# Patient Record
Sex: Female | Born: 1935 | Race: Black or African American | Hispanic: No | Marital: Married | Smoking: Never smoker
Health system: Southern US, Community
[De-identification: ages and names within clinical notes are randomized; demographics above are authoritative.]

## PROBLEM LIST (undated history)

## (undated) DIAGNOSIS — J309 Allergic rhinitis, unspecified: Secondary | ICD-10-CM

## (undated) DIAGNOSIS — I10 Essential (primary) hypertension: Secondary | ICD-10-CM

## (undated) DIAGNOSIS — C50919 Malignant neoplasm of unspecified site of unspecified female breast: Secondary | ICD-10-CM

## (undated) DIAGNOSIS — S62109A Fracture of unspecified carpal bone, unspecified wrist, initial encounter for closed fracture: Secondary | ICD-10-CM

## (undated) DIAGNOSIS — F0390 Unspecified dementia without behavioral disturbance: Secondary | ICD-10-CM

## (undated) DIAGNOSIS — E785 Hyperlipidemia, unspecified: Secondary | ICD-10-CM

## (undated) DIAGNOSIS — M810 Age-related osteoporosis without current pathological fracture: Secondary | ICD-10-CM

## (undated) DIAGNOSIS — R252 Cramp and spasm: Secondary | ICD-10-CM

## (undated) DIAGNOSIS — Z853 Personal history of malignant neoplasm of breast: Secondary | ICD-10-CM

## (undated) HISTORY — DX: Cramp and spasm: R25.2

## (undated) HISTORY — DX: Allergic rhinitis, unspecified: J30.9

## (undated) HISTORY — PX: APPENDECTOMY: SHX54

## (undated) HISTORY — DX: Fracture of unspecified carpal bone, unspecified wrist, initial encounter for closed fracture: S62.109A

## (undated) HISTORY — PX: MODIFIED RADICAL MASTECTOMY W/ AXILLARY LYMPH NODE DISSECTION: SHX2042

## (undated) HISTORY — DX: Personal history of malignant neoplasm of breast: Z85.3

## (undated) HISTORY — PX: OOPHORECTOMY: SHX86

## (undated) HISTORY — DX: Hyperlipidemia, unspecified: E78.5

## (undated) HISTORY — DX: Age-related osteoporosis without current pathological fracture: M81.0

## (undated) HISTORY — DX: Malignant neoplasm of unspecified site of unspecified female breast: C50.919

## (undated) HISTORY — DX: Unspecified dementia, unspecified severity, without behavioral disturbance, psychotic disturbance, mood disturbance, and anxiety: F03.90

## (undated) HISTORY — DX: Essential (primary) hypertension: I10

## (undated) HISTORY — PX: ABDOMINAL HYSTERECTOMY: SHX81

---

## 1998-01-10 ENCOUNTER — Ambulatory Visit (HOSPITAL_COMMUNITY): Admission: RE | Admit: 1998-01-10 | Discharge: 1998-01-10 | Payer: Self-pay | Admitting: Internal Medicine

## 1998-01-10 ENCOUNTER — Encounter: Payer: Self-pay | Admitting: Internal Medicine

## 1999-02-05 ENCOUNTER — Ambulatory Visit (HOSPITAL_COMMUNITY): Admission: RE | Admit: 1999-02-05 | Discharge: 1999-02-05 | Payer: Self-pay | Admitting: Internal Medicine

## 1999-02-05 ENCOUNTER — Encounter: Payer: Self-pay | Admitting: Internal Medicine

## 2000-04-20 ENCOUNTER — Ambulatory Visit (HOSPITAL_COMMUNITY): Admission: RE | Admit: 2000-04-20 | Discharge: 2000-04-20 | Payer: Self-pay | Admitting: Internal Medicine

## 2000-04-20 ENCOUNTER — Encounter: Payer: Self-pay | Admitting: Internal Medicine

## 2002-04-03 ENCOUNTER — Encounter: Payer: Self-pay | Admitting: Internal Medicine

## 2002-04-03 ENCOUNTER — Ambulatory Visit (HOSPITAL_COMMUNITY): Admission: RE | Admit: 2002-04-03 | Discharge: 2002-04-03 | Payer: Self-pay | Admitting: Internal Medicine

## 2004-05-29 ENCOUNTER — Ambulatory Visit: Payer: Self-pay | Admitting: Internal Medicine

## 2004-07-01 ENCOUNTER — Ambulatory Visit: Payer: Self-pay | Admitting: Internal Medicine

## 2005-06-18 ENCOUNTER — Ambulatory Visit: Payer: Self-pay | Admitting: Internal Medicine

## 2005-06-19 ENCOUNTER — Ambulatory Visit: Payer: Self-pay | Admitting: Internal Medicine

## 2006-06-23 ENCOUNTER — Ambulatory Visit: Payer: Self-pay | Admitting: Internal Medicine

## 2006-06-23 LAB — CONVERTED CEMR LAB
ALT: 26 units/L (ref 0–40)
AST: 35 units/L (ref 0–37)
BUN: 13 mg/dL (ref 6–23)
CO2: 30 meq/L (ref 19–32)
Calcium: 9.3 mg/dL (ref 8.4–10.5)
Chloride: 103 meq/L (ref 96–112)
Cholesterol: 234 mg/dL (ref 0–200)
Creatinine, Ser: 1 mg/dL (ref 0.4–1.2)
Direct LDL: 148.8 mg/dL
GFR calc Af Amer: 69 mL/min
GFR calc non Af Amer: 57 mL/min
Glucose, Bld: 90 mg/dL (ref 70–99)
HDL: 59.2 mg/dL (ref >39.0)
Potassium: 3.7 meq/L (ref 3.5–5.1)
Sodium: 141 meq/L (ref 135–145)
TSH: 5.35 microintl units/mL (ref 0.35–5.50)
Total CHOL/HDL Ratio: 4
Triglycerides: 85 mg/dL (ref 0–149)
VLDL: 17 mg/dL (ref 0–40)

## 2007-03-23 ENCOUNTER — Encounter: Payer: Self-pay | Admitting: *Deleted

## 2007-03-23 DIAGNOSIS — Z9089 Acquired absence of other organs: Secondary | ICD-10-CM | POA: Insufficient documentation

## 2007-03-23 DIAGNOSIS — I10 Essential (primary) hypertension: Secondary | ICD-10-CM | POA: Insufficient documentation

## 2007-03-23 DIAGNOSIS — E785 Hyperlipidemia, unspecified: Secondary | ICD-10-CM | POA: Insufficient documentation

## 2007-03-23 DIAGNOSIS — R252 Cramp and spasm: Secondary | ICD-10-CM | POA: Insufficient documentation

## 2007-03-23 DIAGNOSIS — J309 Allergic rhinitis, unspecified: Secondary | ICD-10-CM | POA: Insufficient documentation

## 2007-03-23 DIAGNOSIS — S62109A Fracture of unspecified carpal bone, unspecified wrist, initial encounter for closed fracture: Secondary | ICD-10-CM | POA: Insufficient documentation

## 2007-03-23 DIAGNOSIS — M810 Age-related osteoporosis without current pathological fracture: Secondary | ICD-10-CM | POA: Insufficient documentation

## 2007-05-05 ENCOUNTER — Ambulatory Visit (HOSPITAL_COMMUNITY): Admission: RE | Admit: 2007-05-05 | Discharge: 2007-05-05 | Payer: Self-pay | Admitting: Internal Medicine

## 2007-06-08 LAB — HM MAMMOGRAPHY: HM Mammogram: NORMAL

## 2007-07-14 ENCOUNTER — Ambulatory Visit: Payer: Self-pay | Admitting: Internal Medicine

## 2007-07-14 LAB — CONVERTED CEMR LAB
ALT: 29 units/L (ref 0–35)
AST: 36 units/L (ref 0–37)
Albumin: 4.2 g/dL (ref 3.5–5.2)
Alkaline Phosphatase: 74 units/L (ref 39–117)
BUN: 13 mg/dL (ref 6–23)
Bilirubin, Direct: 0.1 mg/dL (ref 0.0–0.3)
CO2: 29 meq/L (ref 19–32)
Calcium: 9.1 mg/dL (ref 8.4–10.5)
Chloride: 98 meq/L (ref 96–112)
Cholesterol: 239 mg/dL (ref 0–200)
Creatinine, Ser: 0.9 mg/dL (ref 0.4–1.2)
Direct LDL: 148.9 mg/dL
GFR calc Af Amer: 77 mL/min
GFR calc non Af Amer: 64 mL/min
Glucose, Bld: 98 mg/dL (ref 70–99)
HDL: 63.5 mg/dL (ref >39.0)
Potassium: 3.7 meq/L (ref 3.5–5.1)
Sodium: 139 meq/L (ref 135–145)
TSH: 4.38 microintl units/mL (ref 0.35–5.50)
Total Bilirubin: 0.9 mg/dL (ref 0.3–1.2)
Total CHOL/HDL Ratio: 3.8
Total Protein: 7.4 g/dL (ref 6.0–8.3)
Triglycerides: 51 mg/dL (ref 0–149)
VLDL: 10 mg/dL (ref 0–40)

## 2007-07-15 ENCOUNTER — Encounter: Payer: Self-pay | Admitting: Internal Medicine

## 2008-10-09 ENCOUNTER — Ambulatory Visit: Payer: Self-pay | Admitting: Internal Medicine

## 2008-10-09 DIAGNOSIS — Z853 Personal history of malignant neoplasm of breast: Secondary | ICD-10-CM | POA: Insufficient documentation

## 2008-10-09 LAB — CONVERTED CEMR LAB
BUN: 13 mg/dL (ref 6–23)
Basophils Absolute: 0 10*3/uL (ref 0.0–0.1)
Basophils Relative: 0.2 % (ref 0.0–3.0)
CO2: 33 meq/L — ABNORMAL HIGH (ref 19–32)
Calcium: 9.1 mg/dL (ref 8.4–10.5)
Chloride: 105 meq/L (ref 96–112)
Creatinine, Ser: 1 mg/dL (ref 0.4–1.2)
Eosinophils Absolute: 0.1 10*3/uL (ref 0.0–0.7)
Eosinophils Relative: 1.5 % (ref 0.0–5.0)
GFR calc non Af Amer: 68.17 mL/min (ref >60)
Glucose, Bld: 95 mg/dL (ref 70–99)
HCT: 41 % (ref 36.0–46.0)
Hemoglobin: 14 g/dL (ref 12.0–15.0)
Lymphocytes Relative: 34.9 % (ref 12.0–46.0)
Lymphs Abs: 2.1 10*3/uL (ref 0.7–4.0)
MCHC: 34.1 g/dL (ref 30.0–36.0)
MCV: 95 fL (ref 78.0–100.0)
Magnesium: 2.6 mg/dL — ABNORMAL HIGH (ref 1.5–2.5)
Monocytes Absolute: 0.5 10*3/uL (ref 0.1–1.0)
Monocytes Relative: 7.9 % (ref 3.0–12.0)
Neutro Abs: 3.2 10*3/uL (ref 1.4–7.7)
Neutrophils Relative %: 55.5 % (ref 43.0–77.0)
Platelets: 233 10*3/uL (ref 150.0–400.0)
Potassium: 3.6 meq/L (ref 3.5–5.1)
RBC: 4.31 M/uL (ref 3.87–5.11)
RDW: 12.3 % (ref 11.5–14.6)
Sodium: 142 meq/L (ref 135–145)
WBC: 5.9 10*3/uL (ref 4.5–10.5)

## 2009-10-09 ENCOUNTER — Encounter: Payer: Self-pay | Admitting: Internal Medicine

## 2009-10-10 ENCOUNTER — Ambulatory Visit: Payer: Self-pay | Admitting: Internal Medicine

## 2009-10-10 LAB — CONVERTED CEMR LAB
ALT: 24 units/L (ref 0–35)
AST: 28 units/L (ref 0–37)
Albumin: 4.2 g/dL (ref 3.5–5.2)
Alkaline Phosphatase: 68 units/L (ref 39–117)
BUN: 13 mg/dL (ref 6–23)
Basophils Absolute: 0 10*3/uL (ref 0.0–0.1)
Basophils Relative: 0 % (ref 0.0–3.0)
Bilirubin, Direct: 0.2 mg/dL (ref 0.0–0.3)
CO2: 28 meq/L (ref 19–32)
Calcium: 9.3 mg/dL (ref 8.4–10.5)
Chloride: 103 meq/L (ref 96–112)
Cholesterol: 256 mg/dL — ABNORMAL HIGH (ref 0–200)
Creatinine, Ser: 0.9 mg/dL (ref 0.4–1.2)
Direct LDL: 176.2 mg/dL
Eosinophils Absolute: 0.1 10*3/uL (ref 0.0–0.7)
Eosinophils Relative: 1.9 % (ref 0.0–5.0)
GFR calc non Af Amer: 78.82 mL/min (ref >60)
Glucose, Bld: 84 mg/dL (ref 70–99)
HCT: 40.6 % (ref 36.0–46.0)
HDL: 60.5 mg/dL (ref >39.00)
Hemoglobin: 13.8 g/dL (ref 12.0–15.0)
Lymphocytes Relative: 28.2 % (ref 12.0–46.0)
Lymphs Abs: 1.8 10*3/uL (ref 0.7–4.0)
MCHC: 33.9 g/dL (ref 30.0–36.0)
MCV: 96.4 fL (ref 78.0–100.0)
Monocytes Absolute: 0.4 10*3/uL (ref 0.1–1.0)
Monocytes Relative: 6.8 % (ref 3.0–12.0)
Neutro Abs: 4 10*3/uL (ref 1.4–7.7)
Neutrophils Relative %: 63.1 % (ref 43.0–77.0)
Platelets: 253 10*3/uL (ref 150.0–400.0)
Potassium: 4.4 meq/L (ref 3.5–5.1)
RBC: 4.21 M/uL (ref 3.87–5.11)
RDW: 13.4 % (ref 11.5–14.6)
Sodium: 140 meq/L (ref 135–145)
Total Bilirubin: 0.9 mg/dL (ref 0.3–1.2)
Total CHOL/HDL Ratio: 4
Total Protein: 7.2 g/dL (ref 6.0–8.3)
Triglycerides: 124 mg/dL (ref 0.0–149.0)
VLDL: 24.8 mg/dL (ref 0.0–40.0)
WBC: 6.3 10*3/uL (ref 4.5–10.5)

## 2009-10-13 ENCOUNTER — Telehealth: Payer: Self-pay | Admitting: Internal Medicine

## 2009-12-23 ENCOUNTER — Telehealth: Payer: Self-pay | Admitting: Internal Medicine

## 2010-01-10 ENCOUNTER — Telehealth: Payer: Self-pay | Admitting: Internal Medicine

## 2010-01-13 ENCOUNTER — Ambulatory Visit: Payer: Self-pay | Admitting: Internal Medicine

## 2010-01-13 DIAGNOSIS — F0390 Unspecified dementia without behavioral disturbance: Secondary | ICD-10-CM | POA: Insufficient documentation

## 2010-01-13 DIAGNOSIS — F22 Delusional disorders: Secondary | ICD-10-CM

## 2010-03-27 NOTE — Progress Notes (Signed)
Summary: OV NEXT WEEK  Phone Note Call from Patient   Summary of Call: Pt's granddaughter called. She is concerned about pt's behavior. Pt has decreased her walking & driving - Pt feels this is due to fear. Pt had her oil changed and they accidently broke her tail light. Pt felt that there was a conspiracy - the car place was going to put a GPS in her taillight and follow her home and break in the house. She also thought that life alert, onstar and Dr Debby Bud all were working together to know when she took her meds. She is very detailed w/her stories and believes them. Family is worried by this new anxiety & paranoia. This is not a sudden onset, family has noticed over several weeks.   Pt has apt next week.  Initial call taken by: Lamar Sprinkles, CMA,  January 10, 2010 2:36 PM

## 2010-03-27 NOTE — Progress Notes (Signed)
----   Converted from flag ---- ---- 10/11/2009 9:20 AM, Verdell Face wrote: Dr Debby Bud,   I called pt to set up appt and she does not want to sched at this time she will call in the future to set it up.  Cheryl  ---- 10/11/2009 3:50 AM, Jacques Navy MD wrote: 'Morning!!  Please: 1. schedule for DXA 2. needs returns office visit 3 weeks after her DXA scan  Thanks ------------------------------

## 2010-03-27 NOTE — Assessment & Plan Note (Signed)
Summary: follow up per md-lb   Vital Signs:  Patient profile:   77 year old female Weight:      128 pounds BMI:     25.09 Temp:     98.4 degrees F oral Pulse rate:   96 / minute BP sitting:   170 / 82  (left arm) Cuff size:   regular  Vitals Entered By: Lamar Sprinkles, CMA (January 13, 2010 10:41 AM) CC: Problems w/Diovan - has been off med x 14 days.    Primary Care Troy Hartzog:  [facility]  CC:  Problems w/Diovan - has been off med x 14 days. Marland Kitchen  History of Present Illness: Patient presents with her daughter and granddaughter who called with concerns about her behavior. She has multiple well contructed paranoid delusions: contaimnated city water, electronic interference with her medications, etc. Her family reports that she is having more difficulty processing information and is highly influenced by what she perceives. She has become slightly more withdrawn, stopped some of her activities and is drivng less - due to her delusions about her car electronics.  Her daughter feels she is better over the past 12 days having stopped diovan/hct.  Allergies: 1)  ! Sulfa  Past History:  Past Medical History: Last updated: 2008/11/08 OSTEOPOROSIS (ICD-733.00) ALLERGIC RHINITIS (ICD-477.9) LEG CRAMPS, NOCTURNAL (ICD-729.82) Hx of FRACTURE, WRIST, LEFT (ICD-814.00) ADENOCARCINOMA, BREAST, HX OF (ICD-V10.3) HYPERLIPIDEMIA, MILD (ICD-272.4) HYPERTENSION (ICD-401.9)     Past Surgical History: Last updated: 11-08-08 APPENDECTOMY, HX OF (ICD-V45.79) MASTECTOMY, RADICAL, WITH AXILLARY LYMPH NODES, HX OF LEFT (ICD-V45.71) Hysterectomy Oophorectomy  Family History: Last updated: Nov 08, 2008 father - deceased : CVA mother-deceased: respiratory failure, progressive Alzheimer's Neg- DM, breast Cancer, colon cancer  Social History: Last updated: 10/10/2009 college graduate Native of [place] married '50- widowed 10/09 2 daughters; 5 grandchildren; several great grandchildren Lives  alone and remains independent.  She does have [employer] in her vehicle; full life-alert system in the home. She has bought her burial plot.   Review of Systems  The patient denies anorexia, fever, weight loss, decreased hearing, chest pain, dyspnea on exertion, headaches, abdominal pain, muscle weakness, and unusual weight change.    Physical Exam  General:  Well-developed,well-nourished,in no acute distress; alert,appropriate and cooperative throughout examination Head:  Normocephalic and atraumatic without obvious abnormalities. No apparent alopecia or balding. Eyes:  No corneal or conjunctival inflammation noted. EOMI. Perrla. Funduscopic exam benign, without hemorrhages, exudates or papilledema. Vision grossly normal. Lungs:  Normal respiratory effort, chest expands symmetrically. Lungs are clear to auscultation, no crackles or wheezes. Heart:  normal rate and regular rhythm.   Skin:  turgor normal and color normal.   Psych:  oriented x 3. Pressured speech, rapid flow of ideas, paranoid delusional constructs.   Impression & Recommendations:  Problem # 1:  HYPERTENSION (ICD-401.9) Poor control today - off meds for 12 days.  Plan - resume diovan plain at previous dose.   Her updated medication list for this problem includes:    Diovan 80 Mg Tabs (Valsartan) .Marland Kitchen... 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  Plan - willneed neuro-imaging           will need to consider low dose anti-psychotic medication - patient currently refusing psych eval.            f/u visit soon.   Complete Medication List: 1)  Diovan  80 Mg Tabs (Valsartan) .Marland Kitchen.. 1 once daily 2)  Advil 200 Mg Tabs (Ibuprofen) .... As directed as needed Prescriptions: DIOVAN 80 MG TABS (VALSARTAN) 1 once daily  #90 x 1   Entered by:   Lamar Sprinkles, CMA   Authorized by:    Jacques Navy MD   Signed by:   Lamar Sprinkles, CMA on 01/13/2010   Method used:   Electronically to        [facility]  [phone]* (retail)       [address]       [place], [place]  [address]       Ph: [phone] or [phone]       Fax: [phone]   RxID:   [id number]    Orders Added: 1)  Est. Patient Level IV [[address]644]

## 2010-03-27 NOTE — Progress Notes (Signed)
Summary: MED REACTION?  Phone Note Call from Patient Call back at [facility] Phone [phone]   Caller: Patient Summary of Call: Pt thinks she is having a problem w/her Diovan HCT 80-12.5 tab , blurry eyes and wants to be put back on prev medication -- Diovan 80 mg tab. Pt requests a call from a nurse asap. Initial call taken by: Verdell Face,  December 23, 2009 10:09 AM  Follow-up for Phone Call        Spoke w/pt, she c/o left sided h/a, blurred vision and right hand swelling since starting Diovan HCT. She wants to resume DIOVAN 80mg .   Pt thinks that the HCTZ has sulfa in it and she is allergic. She also wanted to tell MD that she takes long walks every day to get the medication out of her system but continues to take the med daily.   She started the new med w/HCTZ in August and felt that she needed time to get adjusted to the medication but now wants to resume Diovan 80mg . Please advise.  Follow-up by: Lamar Sprinkles, CMA,  December 23, 2009 12:15 PM  Additional Follow-up for Phone Call Additional follow up Details #1::        OK to resume diovan 80, #30, refill prn.. will need BP check in 3 weeks.  Additional Follow-up by: Jacques Navy MD,  December 23, 2009 1:09 PM    Additional Follow-up for Phone Call Additional follow up Details #2::    Pt informed, FYI - pt wanted Dr Debby Bud to know she did the women's only walk in 47 minutes.  Follow-up by: Lamar Sprinkles, CMA,  December 23, 2009 1:22 PM  New/Updated Medications: DIOVAN 80 MG TABS (VALSARTAN) 1 once daily Prescriptions: DIOVAN 80 MG TABS (VALSARTAN) 1 once daily  #90 x 1   Entered by:   Lamar Sprinkles, CMA   Authorized by:   Jacques Navy MD   Signed by:   Lamar Sprinkles, CMA on 12/23/2009   Method used:   Electronically to        [facility]  [phone]* (retail)       [address]       [place], [place]  [address]       Ph: [phone] or [phone]       Fax: [phone]   RxID:    [id number]

## 2010-03-27 NOTE — Progress Notes (Signed)
Summary: Life Alert ID Card  Life Alert ID Card   Imported By: Lester [place] 10/14/2009 08:10:02  _____________________________________________________________________  External Attachment:    Type:   Image     Comment:   External Document

## 2010-03-27 NOTE — Assessment & Plan Note (Signed)
Summary: YEARLY FU/ MEDICARE/LABS SAME DAY/NWS  #   Vital Signs:  Patient profile:   77 year old female Height:      60 inches Weight:      131 pounds BMI:     25.68 O2 Sat:      97 % on Room air Temp:     98.4 degrees F oral Pulse rate:   71 / minute BP sitting:   210 / 98  (left arm) Cuff size:   regular  Vitals Entered By: Bill Salinas CMA (October 10, 2009 10:09 AM)  O2 Flow:  Room air CC: pt here for yearly physical/ ab  Does patient need assistance? Ambulation Normal Comments Pt now had Life Alert.   Vision Screening:      Vision Comments: Pt had eye exam April 2011 Normal Exam at [facility]   Primary Care Provider:  Willma Obando  CC:  pt here for yearly physical/ ab.  History of Present Illness: Patient presents for follow-up. She is anxious in the office. She has a real concern about dementia and is very worried about medicine as a cause of mental status changes.   Her BP is very high today. She reports that her BP at home runs 140+ on a regular basis.  she remains active and is playing tennis and walks. She is very health conscious. She is living alone - husband passed away 07-11-2022- and remains very independent in all her ADLs.   She has no specific medical complaints.  She does not continue with mammograms; she has aged out of colonoscopy. She has declined all immunizations. Gave a full explanation of the rationale for pneumonia vaccine and shingles vaccine. After being informed she still declines immunizations.   She feels fine. She has a great appetite and eats a very healthy diet.   Preventive Screening-Counseling & Management  Alcohol-Tobacco     Alcohol drinks/day: 0     Smoking Status: never  Caffeine-Diet-Exercise     Caffeine use/day: 2 cups a day     Does Patient Exercise: yes     Type of exercise: Walking     Exercise (avg: min/session): 30-60     Times/week: 7  Hep-HIV-STD-Contraception     Dental Visit-last 6 months yes     Sun  Exposure-Excessive: no  Safety-Violence-Falls     Seat Belt Use: yes     Firearms in the Home: no firearms in the home     Smoke Detectors: yes     Violence in the Home: no risk noted     Sexual Abuse: no     Fall Risk: low risk      Drug Use:  never.    Current Medications (verified): 1)  Diovan 80 Mg  Tabs (Valsartan) .... Take One Tablet Once Daily 2)  Advil 200 Mg  Tabs (Ibuprofen) .... As Directed As Needed  Allergies (verified): 1)  ! Sulfa  Past History:  Past Medical History: Last updated: 10/18/2008 OSTEOPOROSIS (ICD-733.00) ALLERGIC RHINITIS (ICD-477.9) LEG CRAMPS, NOCTURNAL (ICD-729.82) Hx of FRACTURE, WRIST, LEFT (ICD-814.00) ADENOCARCINOMA, BREAST, HX OF (ICD-V10.3) HYPERLIPIDEMIA, MILD (ICD-272.4) HYPERTENSION (ICD-401.9)     Past Surgical History: Last updated: 10-18-2008 APPENDECTOMY, HX OF (ICD-V45.79) MASTECTOMY, RADICAL, WITH AXILLARY LYMPH NODES, HX OF LEFT (ICD-V45.71) Hysterectomy Oophorectomy  Family History: Last updated: 10/18/2008 father - deceased : CVA mother-deceased: respiratory failure, progressive Alzheimer's Neg- DM, breast Cancer, colon cancer  Social History: Last updated: 10/10/2009 college graduate Native of [place] married '50- widowed 10/09 2  daughters; 5 grandchildren; several great grandchildren Lives alone and remains independent.  She does have [employer] in her vehicle; full life-alert system in the home. She has bought her burial plot.   Social History: college graduate Native of [place] married '50- widowed 10/09 2 daughters; 5 grandchildren; several great grandchildren Lives alone and remains independent.  She does have [employer]-Star in her vehicle; full life-alert system in the home. She has bought her burial plot. Caffeine use/day:  2 cups a day Dental Care w/in 6 mos.:  yes Sun Exposure-Excessive:  no Seat Belt Use:  yes Fall Risk:  low risk Drug Use:  never  Review of Systems  The patient denies  anorexia, fever, weight loss, weight gain, vision loss, decreased hearing, hoarseness, chest pain, syncope, dyspnea on exertion, peripheral edema, headaches, hemoptysis, abdominal pain, hematochezia, incontinence, muscle weakness, transient blindness, difficulty walking, depression, abnormal bleeding, and angioedema.    Physical Exam  General:  WNWD AA female who looks younger than her stated age Head:  Normocephalic and atraumatic without obvious abnormalities. No apparent alopecia or balding. Eyes:  No corneal or conjunctival inflammation noted. EOMI. Perrla. Funduscopic exam benign, without hemorrhages, exudates or papilledema. Vision grossly normal. Ears:  R ear normal and L ear normal.   Nose:  no external deformity and no external erythema.   Mouth:  Oral mucosa and oropharynx without lesions or exudates.  Teeth in good repair. Neck:  supple, full ROM, no thyromegaly, no carotid bruits, and no cervical lymphadenopathy.   Chest Wall:  No deformities, masses, or tenderness noted. Breasts:  s/p left radical mastectomy. Right breast - no nipple discharge, no thickening., no nodules or abnormality. Lungs:  normal respiratory effort, no intercostal retractions, no accessory muscle use, and normal breath sounds.   Heart:  normal rate, regular rhythm, no murmur, no gallop, no JVD, and no HJR.   Abdomen:  soft, non-tender, normal bowel sounds, no masses, no guarding, no abdominal hernia, and no inguinal hernia.   Msk:  normal ROM, no joint tenderness, no joint swelling, no joint warmth, and no joint deformities.   Pulses:  2+ radial and DP pulses Extremities:  trace left pedal edema.   Neurologic:  alert & oriented X3, cranial nerves II-XII intact, strength normal in all extremities, sensation intact to light touch, gait normal, and DTRs symmetrical and normal.   Skin:  turgor normal, color normal, no rashes, no suspicious lesions, no petechiae, and no ulcerations.   Cervical Nodes:  no anterior  cervical adenopathy and no posterior cervical adenopathy.   Axillary Nodes:  no R axillary adenopathy and no L axillary adenopathy.   Psych:  Oriented X3, memory intact for recent and remote, normally interactive, good eye contact, and not anxious appearing.     Impression & Recommendations:  Problem # 1:  OSTEOPOROSIS (ICD-733.00) No DXA scan in EMR. No bone enhancing medications listed. She does follow a healthy diet but no calcium or vitamin D supplement is listed.  Plan - will schedule DXA scan with recommendations to follow.  Problem # 2:  HYPERLIPIDEMIA, MILD (ICD-272.4) Due for routine lab follow-up with recommendations to follow.  Orders: TLB-Lipid Panel (80061-LIPID) TLB-Hepatic/Liver Function Pnl (80076-HEPATIC)  Addendum - very high LDL along with a high HDL. Per NCEP guidelines she is a candidate for medical intervention.  Plan - will have her return to discuss medical therapy for elevated cholesterol levels  Problem # 3:  HYPERTENSION (ICD-401.9)  Her updated medication list for this problem includes:  Diovan Hct 80-12.5 Mg Tabs (Valsartan-hydrochlorothiazide) .Marland Kitchen... 1 by mouth once daily  Orders: TLB-BMP (Basic Metabolic Panel-BMET) (80048-METABOL)  BP today: 210/98  Poor control at today's visit and last visit. She claims better control when BP checked outside the office with SBP 140-150's. She is asymptomatic.  Plan - change medication to Diovan/HCT           follow-up BP check  Problem # 4:  Preventive Health Care (ICD-V70.0)  Patient reports no change in medical condition and has no complaints. Her exam is normal except for elevated BP. Lab results are normal except for cholesterol panel. She has consistently declined colonoscopy and last mammogrm was '09 and she declines further studies. She has declined all immunizations.  Ms. Bolanos does live alone and is independent in all ADLs. She shows signs of anxiety but appears rational and free of depression. She  is very active with no increased risk of falls or injury.   In summary - a youthful patient who has uncontrolled hypertension and lipid elevation and who declinces preventive recommendations. She will be asked to return for a DXA scan and a return office visit to follow.   Orders: [facility] -Subsequent Annual Wellness Visit [phone])  Complete Medication List: 1)  Diovan Hct 80-12.5 Mg Tabs (Valsartan-hydrochlorothiazide) .Marland Kitchen.. 1 by mouth once daily 2)  Advil 200 Mg Tabs (Ibuprofen) .... As directed as needed  Other Orders: TLB-CBC Platelet - w/Differential (85025-CBCD)  ent: Miette Mccoin Note: All result statuses are Final unless otherwise noted.  Tests: (1) Lipid Panel (LIPID)   Cholesterol          [H]  256 mg/dL                   9-811     ATP III Classification            Desirable:  < 200 mg/dL                    Borderline High:  200 - 239 mg/dL               High:  > = 240 mg/dL   Triglycerides             124.0 mg/dL                 9.1-478.2     Normal:  <150 mg/dL     Borderline High:  956 - 199 mg/dL   HDL                       21.30 mg/dL                 >86.57   VLDL Cholesterol          24.8 mg/dL                  8.4-69.6  CHO/HDL Ratio:  CHD Risk                             4                    Men          Women     1/2 Average Risk     3.4          3.3     Average Risk  5.0          4.4     2X Average Risk          9.6          7.1     3X Average Risk          15.0          11.0                           Tests: (2) Hepatic/Liver Function Panel (HEPATIC)   Total Bilirubin           0.9 mg/dL                   0.3-4.7   Direct Bilirubin          0.2 mg/dL                   4.2-5.9   Alkaline Phosphatase      68 U/L                      39-117   AST                       28 U/L                      0-37   ALT                       24 U/L                      0-35   Total Protein             7.2 g/dL                    5.6-3.8   Albumin                   4.2  g/dL                    7.5-6.4  Tests: (3) BMP (METABOL)   Sodium                    140 mEq/L                   135-145   Potassium                 4.4 mEq/L                   3.5-5.1   Chloride                  103 mEq/L                   96-112   Carbon Dioxide            28 mEq/L                    19-32   Glucose                   84 mg/dL                    33-29   BUN  13 mg/dL                    4-40   Creatinine                0.9 mg/dL                   1.0-2.7   Calcium                   9.3 mg/dL                   2.5-36.6   GFR                       78.82 mL/min                >60  Tests: (4) CBC Platelet w/Diff (CBCD)   White Cell Count          6.3 K/uL                    4.5-10.5   Red Cell Count            4.21 Mil/uL                 3.87-5.11   Hemoglobin                13.8 g/dL                   44.0-34.7   Hematocrit                40.6 %                      36.0-46.0   MCV                       96.4 fl                     78.0-100.0   MCHC                      33.9 g/dL                   42.5-95.6   RDW                       13.4 %                      11.5-14.6   Platelet Count            253.0 K/uL                  150.0-400.0   Neutrophil %              63.1 %                      43.0-77.0   Lymphocyte %              28.2 %                      12.0-46.0   Monocyte %                6.8 %  3.0-12.0   Eosinophils%              1.9 %                       0.0-5.0   Basophils %               0.0 %                       0.0-3.0   Neutrophill Absolute      4.0 K/uL                    1.4-7.7   Lymphocyte Absolute       1.8 K/uL                    0.7-4.0   Monocyte Absolute         0.4 K/uL                    0.1-1.0  Eosinophils, Absolute                             0.1 K/uL                    0.0-0.7   Basophils Absolute        0.0 K/uL                    0.0-0.1  Tests: (5) Cholesterol LDL - Direct (DIRLDL)   Cholesterol LDL - Direct                             176.2 mg/dL     Optimal:  <161 mg/dL     Near or Above Optimal:  100-129 mg/dL     Borderline High:  096-045 mg/dL     High:  409-811 mg/dL     Very High:  >914 mg/dLPrescriptions: DIOVAN HCT 80-12.5 MG TABS (VALSARTAN-HYDROCHLOROTHIAZIDE) 1 by mouth once daily  #30 x 12   Entered and Authorized by:   Jacques Navy MD   Signed by:   Lamar Sprinkles, CMA on 10/10/2009   Method used:   Electronically to        [facility]  [phone]* (retail)       [address]       [place], [place]  [address]       Ph: [phone] or [phone]       Fax: [phone]   RxID:   [id number]   Preventive Care Screening  Last Pneumovax:    Date:  10/10/2009    Results:  Dclined  Last Flu Shot:    Date:  10/10/2009    Results:  Declined  Last Tetanus Booster:    Date:  10/10/2009    Results:  Declined  Colonoscopy:    Date:  10/10/2009    Results:  Declined

## 2010-05-16 ENCOUNTER — Telehealth: Payer: Self-pay | Admitting: *Deleted

## 2010-05-16 NOTE — Telephone Encounter (Signed)
OK. Please have patient called to come in for routine BP check/follow-up. It is important that a family member comes to the visit with her.  Thanks

## 2010-05-16 NOTE — Telephone Encounter (Signed)
Pt's daughter left vm, she feels that pt's confusion and paranoia have increased. She feels that pt needs re-eval from MD but does not think that pt will be willing to come in if she suggests and would like our office to call pt to schedule for another reason... ? BP check. What do you think?

## 2010-05-19 NOTE — Telephone Encounter (Signed)
Spoke w/daughter, we will attempt to schedule pt for f/u apt.

## 2010-06-05 ENCOUNTER — Encounter: Payer: Self-pay | Admitting: Internal Medicine

## 2010-06-05 ENCOUNTER — Ambulatory Visit (INDEPENDENT_AMBULATORY_CARE_PROVIDER_SITE_OTHER): Payer: Medicare Other | Admitting: Internal Medicine

## 2010-06-05 DIAGNOSIS — I10 Essential (primary) hypertension: Secondary | ICD-10-CM

## 2010-06-05 DIAGNOSIS — F0392 Unspecified dementia, unspecified severity, with psychotic disturbance: Secondary | ICD-10-CM

## 2010-06-05 DIAGNOSIS — F0390 Unspecified dementia without behavioral disturbance: Secondary | ICD-10-CM

## 2010-06-05 DIAGNOSIS — F22 Delusional disorders: Secondary | ICD-10-CM
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
## 2010-06-16 ENCOUNTER — Telehealth: Payer: Self-pay | Admitting: *Deleted

## 2010-06-16 DIAGNOSIS — R4182 Altered mental status, unspecified: Secondary | ICD-10-CM

## 2010-06-16 NOTE — Telephone Encounter (Signed)
Daughter is req "brain scan" prior to psych work up. She has MD friend who suggested they inquire about this first due to pt's delusions to r/o stroke or other causes.

## 2010-06-17 NOTE — Telephone Encounter (Signed)
Daughter aware, will need to call pt and explain that MD is ordering MRI (PCC's already called pt and she refused apt)

## 2010-06-17 NOTE — Telephone Encounter (Signed)
I think it is a very long shot....Marland KitchenMarland KitchenOK for MRI brain without contrast to rule out tumor or stroke as cause of mental status changes.

## 2010-06-23 ENCOUNTER — Other Ambulatory Visit (HOSPITAL_COMMUNITY): Payer: Medicare Other

## 2010-06-26 NOTE — Telephone Encounter (Signed)
Hold note - Maralyn Sago to call and discuss more with pt next week.

## 2010-07-11 NOTE — Assessment & Plan Note (Signed)
[facility]                           PRIMARY CARE OFFICE NOTE   NAME:Vonbehren, JERMYA DOWDING                        MRN:          [id number]  DATE:06/23/2006                            DOB:          August 19, 1924    Mrs. Vinton is a delightful 77 year old African American women who  presents for follow up evaluation and exam.  She was last seen June 18, 2005.  Please see that dictation for a complete past medical history,  family history and social history.   INTERVAL SOCIAL HISTORY:  The patient has been hospitalized with a new  stroke and has now returned home.  This stroke has affected her behavior  significantly and she requires much more attention.  She has a tendency  to wonder, she has a tendency to disrobe and she has even a worse  memory.  The patient is determined in management at home and does have  the help of her children.  She herself is walking daily and looking  forward to resuming tennis.   CURRENT MEDICATIONS:  1. Diovan 80 mg daily.  2. Allegra 180 mg p.r.n.   REVIEW OF SYSTEMS:  Negative for any constitutional, cardiovascular,  respiratory, GI, GU problems.   EXAMINATION:  Temperature was 99.1, blood pressure 181/93, pulse 85,  weight 126.  GENERAL APPEARANCE:  This is a well-nourished, well-developed, well-  groomed women looking younger than her stated chronologic age in no  acute distress.  HEENT:  Normocephalic, atraumatic, EAC's and TM's were normal,  oropharynx with native dentition in good repair, no buccal or pallet  lesions were noted, posterior pharynx was clear, conjunctivae and  sclerae was clear, PERRLA, EM OI, funduscopic exam was unremarkable.  NECK:  Supple without thyromegaly, no cervical adenopathy was noted in  the cervical supraclavicular regions.  CHEST:  No CVA tenderness.  LUNGS:  Clear to auscultation and percussion.  BREAST:  The patient is status post radical mastectomy on the left.  She  has a well healed  surgical scar with no abnormality.  Right breast is  normal with normal skin nipples without discharge, no fixed mass lesion  or abnormality was noted, there is no axillary adenopathy.  CARDIOVASCULAR:  With 2+ radial pulse, no jugular venous distension or  carotid bruits, she has a quiet pericardium with a regular rate and  rhythm without murmurs, rubs or gallops.  ABDOMEN:  Soft, no guarding, no rebound, no organo splenomegaly was  noted.  PELVIC:  Deferred with patient being status post TAH BSO.  RECTAL:  Deferred.  EXTREMITIES:  Without clubbing, cyanosis, edema or deformity.  NEUROLOGIC:  Non focal.   LAB WORK:  Revealed a total cholesterol of 234, triglycerides 85, HDL  was 59.2, LDL cholesterol 148.8, thyroid function with TSH of 5.35,  basic metabolic panel was unremarkable with a serum glucose of 90,  kidney function was normal with a creatinine of 1.0, liver functions  were normal.   ASSESSMENT/PLAN:  1. Hypertension.  The patient reports that she checks her blood      pressure regularly at home and it runs  in the 130's over 80's.      Plan, the patient will continue on her present medication.  She      should continue to check her blood pressure on a regular basis.  2. Hyperlipidemia.  The patient with mildly elevated cholesterol.      Given her robust HDL, her level of activity, her age with no other      cardiac risk factors.  She has a NCEP goal of less than 160.  I      would encourage her to continue her healthy diet and level of      activity.  3. Health maintenance.  The patient's last mammogram was in 2004.  I      offered to schedule the patient's mammogram, but she declined.  She      is reticent to have this done at this time because of the      discomfort.  I have encouraged her to reconsider.   Colorectal cancer screening, I discussed this with the patient including  risks and benefits.  She is very weary and concerned about possible  complications.  She  feels that at her age of 16 with no problems, no  change in bowel habit she will defer colonoscopy at this time.   In summary, this is a very bright and alert women who seems very healthy  and looks younger than her stated age.  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   I have asked the patient to return to see me on a p.r.n. basis or in one  year.     Rosalyn Gess Norins, MD  Electronically Signed    MEN/MedQ  DD: 06/23/2006  DT: 06/24/2006  Job #: [id number]   cc:   Wandalee Ferdinand

## 2010-12-28 ENCOUNTER — Other Ambulatory Visit: Payer: Self-pay | Admitting: Internal Medicine

## 2010-12-31 ENCOUNTER — Other Ambulatory Visit: Payer: Self-pay | Admitting: *Deleted

## 2010-12-31 MED ORDER — VALSARTAN 80 MG PO TABS: 80.0000 mg | ORAL_TABLET | Freq: Every day | ORAL | Status: DC

## 2011-06-28 DIAGNOSIS — S82409A Unspecified fracture of shaft of unspecified fibula, initial encounter for closed fracture: Secondary | ICD-10-CM | POA: Diagnosis not present

## 2012-01-05 ENCOUNTER — Other Ambulatory Visit: Payer: Self-pay | Admitting: Internal Medicine

## 2012-04-05 ENCOUNTER — Other Ambulatory Visit: Payer: Self-pay | Admitting: Internal Medicine

## 2012-05-08 ENCOUNTER — Other Ambulatory Visit: Payer: Self-pay | Admitting: Internal Medicine

## 2012-05-10 ENCOUNTER — Other Ambulatory Visit: Payer: Self-pay

## 2012-05-10 MED ORDER — VALSARTAN 80 MG PO TABS: ORAL_TABLET | ORAL | Status: DC

## 2012-06-06 ENCOUNTER — Other Ambulatory Visit: Payer: Self-pay | Admitting: Internal Medicine

## 2012-06-09 ENCOUNTER — Encounter: Payer: Medicare Other | Admitting: Internal Medicine

## 2012-07-08 ENCOUNTER — Other Ambulatory Visit: Payer: Self-pay | Admitting: Internal Medicine

## 2012-07-11 ENCOUNTER — Other Ambulatory Visit: Payer: Self-pay | Admitting: Internal Medicine

## 2012-08-04 ENCOUNTER — Encounter: Payer: Self-pay | Admitting: Internal Medicine

## 2012-08-04 ENCOUNTER — Ambulatory Visit (INDEPENDENT_AMBULATORY_CARE_PROVIDER_SITE_OTHER): Payer: Medicare Other | Admitting: Internal Medicine

## 2012-08-04 ENCOUNTER — Other Ambulatory Visit: Payer: Medicare Other

## 2012-08-04 VITALS — BP 140/80 | HR 71 | Temp 97.8°F | Resp 16 | Ht 60.75 in | Wt 110.0 lb

## 2012-08-04 DIAGNOSIS — I10 Essential (primary) hypertension: Secondary | ICD-10-CM | POA: Diagnosis not present

## 2012-08-04 DIAGNOSIS — F0390 Unspecified dementia without behavioral disturbance: Secondary | ICD-10-CM

## 2012-08-04 DIAGNOSIS — Z0000 Encounter for general adult medical examination without abnormal findings: Secondary | ICD-10-CM

## 2012-08-04 DIAGNOSIS — E785 Hyperlipidemia, unspecified: Secondary | ICD-10-CM

## 2012-08-04 LAB — COMPREHENSIVE METABOLIC PANEL
Albumin: 4.1 g/dL (ref 3.5–5.2)
BUN: 18 mg/dL (ref 6–23)
Calcium: 9.4 mg/dL (ref 8.4–10.5)
Chloride: 106 mEq/L (ref 96–112)
Glucose, Bld: 78 mg/dL (ref 70–99)
Potassium: 4.1 mEq/L (ref 3.5–5.1)

## 2012-08-04 MED ORDER — VALSARTAN 80 MG PO TABS: 80.0000 mg | ORAL_TABLET | Freq: Every day | ORAL | Status: DC

## 2012-08-04 NOTE — Patient Instructions (Addendum)
Thank you for coming to see me.  Your exam is normal and you look well. Your blood pressure is well controlled @ 140/80 - new guideline recommends systolic blood pressure of 150 or less.  Routine lab is recommended to monitor kidney function and electrolytes.  Come back to see me in 6 months for routine follow up.

## 2012-08-04 NOTE — Progress Notes (Signed)
Subjective:    Patient ID: Sylvia Villarreal, female    DOB: 01/04/1925, 79 y.o.   MRN: [id number]  HPI Mrs. Wooley is here for annual Medicare wellness examination and management of other chronic and acute problems.   She has been doing well w/o any major medical problems, but she has continued pleasant dementia.  The risk factors are reflected in the social history.  The roster of all physicians providing medical care to patient - is listed in the Snapshot section of the chart.  Activities of daily living:  The patient is 100% inedpendent in all ADLs: dressing, toileting, feeding as well as independent mobility  Home safety : The patient has smoke detectors in the home. They wear seatbelts. No firearms at home. There is no violence in the home.   There is no risks for hepatitis, STDs or HIV. There is no   history of blood transfusion. They have no travel history to infectious disease endemic areas of the world.  The patient has noth seen their dentist in the last six month. They have not seen their eye doctor in the last year. They deny any hearing difficulty and have not had audiologic testing in the last year.    They do not  have excessive sun exposure. Discussed the need for sun protection: hats, long sleeves and use of sunscreen if there is significant sun exposure.   Diet: the importance of a healthy diet is discussed. They do have a healthy diet.  The patient has a regular exercise program: walking , 45 min duration, 3 per week.  The benefits of regular aerobic exercise were discussed.  Depression screen: there are no signs or vegative symptoms of depression- irritability, change in appetite, anhedonia, sadness/tearfullness.  Cognitive assessment: Demented - unable to manage her own affairs - her daughters have fiduciary and health care POA. She does confabulated quite a bit.  The following portions of the patient's history were reviewed and updated as appropriate: allergies,  current medications, past family history, past medical history,  past surgical history, past social history  and problem list.  Vision, hearing, body mass index were assessed and reviewed.   During the course of the visit the patient was educated and counseled about appropriate screening and preventive services including : fall prevention , diabetes screening, nutrition counseling, colorectal cancer screening, and recommended immunizations.  Past Medical History  Diagnosis Date  . Osteoporosis, unspecified   . Allergic rhinitis, cause unspecified   . Cramp of limb   . Unspecified closed fracture of carpal bone   . Personal history of malignant neoplasm of breast   . Other and unspecified hyperlipidemia   . Unspecified essential hypertension    Past Surgical History  Procedure Laterality Date  . Appendectomy    . Modified radical mastectomy w/ axillary lymph node dissection    . Abdominal hysterectomy    . Oophorectomy     Family History  Problem Relation Age of Onset  . Alzheimer's disease Mother   . Stroke Father   . Colon cancer Neg Hx   . Breast cancer Neg Hx   . Diabetes Neg Hx    History   Social History  . Marital Status: Married    Spouse Name: N/A    Number of Children: N/A  . Years of Education: N/A   Occupational History  . Not on file.   Social History Main Topics  . Smoking status: Never Smoker   . Smokeless tobacco: Not on file  .  Alcohol Use: No  . Drug Use: No  . Sexually Active: Not on file   Other Topics Concern  . Not on file   Social History Narrative   College graduate   Native of [place]   Married - '50 - widowed 10/09   2 daughters; 5 grandchildren; several great grandchildren   Lives alone and remains independent   Has [employer] in her vehicle; full life alert system in the home   She has bought her burial plot.           Current Outpatient Prescriptions on File Prior to Visit  Medication Sig Dispense Refill  . ibuprofen  (ADVIL,MOTRIN) 200 MG tablet Take 200 mg by mouth as needed.         No current facility-administered medications on file prior to visit.      Review of Systems Constitutional:  Negative for fever, chills, activity change and unexpected weight change.  HEENT:  Negative for hearing loss, ear pain, congestion, neck stiffness and postnasal drip. Negative for sore throat or swallowing problems. Negative for dental complaints.   Eyes: Negative for vision loss or change in visual acuity.  Respiratory: Negative for chest tightness and wheezing. Negative for DOE.   Cardiovascular: Negative for chest pain or palpitations. No decreased exercise tolerance Gastrointestinal: No change in bowel habit. No bloating or gas. No reflux or indigestion Genitourinary: Negative for urgency, frequency, flank pain and difficulty urinating.  Musculoskeletal: Negative for myalgias, back pain, arthralgias and gait problem.  Neurological: Negative for dizziness, tremors, weakness and headaches.  Hematological: Negative for adenopathy.  Psychiatric/Behavioral: Negative for behavioral problems and dysphoric mood.       Objective:   Physical Exam Filed Vitals:   08/04/12 0956  BP: 140/80  Pulse: 71  Temp: 97.8 F (36.6 C)  Resp: 16   Wt Readings from Last 3 Encounters:  08/04/12 110 lb (49.896 kg)  06/05/10 123 lb (55.792 kg)  01/13/10 128 lb (58.06 kg)   Gen'l: well nourished, well developed, AA Woman in no distress, who looks younger than her age HEENT - Perkins/AT, EACs/TMs normal, oropharynx with native dentition in good condition, no buccal or palatal lesions, posterior pharynx clear, mucous membranes moist. C&S clear, PERRLA, fundi - normal Neck - supple, no thyromegaly Nodes- negative submental, cervical, supraclavicular regions Chest - no deformity, no CVAT Lungs - clear without rales, wheezes. No increased work of breathing Breast - deferred Cardiovascular - regular rate and rhythm, quiet precordium,  no murmurs, rubs or gallops, 2+ radial, DP and PT pulses Abdomen - BS+ x 4, no HSM, no guarding or rebound or tenderness Pelvic - deferred -aged out Rectal - deferred  Extremities - no clubbing, cyanosis, edema or deformity.  Neuro - A&O x 3, CN II-XII normal, motor strength normal and equal, DTRs 2+ and symmetrical biceps, radial, and patellar tendons. Cerebellar - no tremor, no rigidity, fluid movement and normal gait. Clear speech but has some delusional thinking. Derm - Head, neck, back, abdomen and extremities without suspicious lesions  Recent Results (from the past 2160 hour(s))  COMPREHENSIVE METABOLIC PANEL     Status: Abnormal   Collection Time    08/04/12 11:04 AM      Result Value Range   Sodium 141  135 - 145 mEq/L   Potassium 4.1  3.5 - 5.1 mEq/L   Chloride 106  96 - 112 mEq/L   CO2 24  19 - 32 mEq/L   Glucose, Bld 78  70 - 99 mg/dL  BUN 18  6 - 23 mg/dL   Creatinine, Ser 1.2  0.4 - 1.2 mg/dL   Total Bilirubin 0.7  0.3 - 1.2 mg/dL   Alkaline Phosphatase 62  39 - 117 U/L   AST 21  0 - 37 U/L   ALT 17  0 - 35 U/L   Total Protein 7.4  6.0 - 8.3 g/dL   Albumin 4.1  3.5 - 5.2 g/dL   Calcium 9.4  8.4 - 21.3 mg/dL   GFR 08.65 (*) >78.46 mL/min          Assessment & Plan:

## 2012-08-05 DIAGNOSIS — Z0000 Encounter for general adult medical examination without abnormal findings: Secondary | ICD-10-CM | POA: Insufficient documentation

## 2012-08-05 NOTE — Assessment & Plan Note (Signed)
She is pleasant and delightful woman. Her daughters are very attentive and although she lives alone she is safe and well cared for. Her delusions and confusion are not threatening. POA and HCPOA are in place.  Plan Continue supportive care.

## 2012-08-05 NOTE — Assessment & Plan Note (Signed)
Interval history w/o major illness or change in condition. Limited physical exam normal. Basic chemistry panel and renal function is normal. She is current with colorectal cancer screening and has aged out for further study. She declines immunization.  In summary  A delightful woman with dementia but remains very functional and independent in ADLs with close family supervision. She will return as needed or in 1 year.

## 2012-08-05 NOTE — Assessment & Plan Note (Signed)
BP Readings from Last 3 Encounters:  08/04/12 140/80  06/05/10 148/88  01/13/10 170/82   Adequate control of blood pressure on single agent (JNC 8 guidelines - recommended parameters for elderly)

## 2012-08-05 NOTE — Assessment & Plan Note (Signed)
LDL in the 170's but high HDL as well. Patient has a low cardiac risk profile: slender, exercises, eats well, non-smoker, controlled blood pressure. She is very healthy at 61 and declines medication.  Plan Continue healthy life-style.

## 2013-02-02 ENCOUNTER — Ambulatory Visit: Payer: Medicare Other | Admitting: Internal Medicine

## 2013-02-21 ENCOUNTER — Other Ambulatory Visit: Payer: Medicare Other

## 2013-02-21 ENCOUNTER — Encounter: Payer: Self-pay | Admitting: Internal Medicine

## 2013-02-21 ENCOUNTER — Ambulatory Visit (INDEPENDENT_AMBULATORY_CARE_PROVIDER_SITE_OTHER): Payer: Medicare Other | Admitting: Internal Medicine

## 2013-02-21 VITALS — BP 182/90 | HR 69 | Temp 97.8°F | Wt 110.4 lb

## 2013-02-21 DIAGNOSIS — Z23 Encounter for immunization: Secondary | ICD-10-CM

## 2013-02-21 DIAGNOSIS — I10 Essential (primary) hypertension: Secondary | ICD-10-CM | POA: Diagnosis not present

## 2013-02-21 DIAGNOSIS — F0390 Unspecified dementia without behavioral disturbance: Secondary | ICD-10-CM

## 2013-02-21 DIAGNOSIS — F0392 Unspecified dementia, unspecified severity, with psychotic disturbance: Secondary | ICD-10-CM | POA: Diagnosis not present

## 2013-02-21 LAB — BASIC METABOLIC PANEL
BUN: 21 mg/dL (ref 6–23)
GFR: 55.19 mL/min — ABNORMAL LOW (ref >60.00)
Potassium: 4 mEq/L (ref 3.5–5.1)
Sodium: 140 mEq/L (ref 135–145)

## 2013-02-21 MED ORDER — VALSARTAN 160 MG PO TABS: 160.0000 mg | ORAL_TABLET | Freq: Every day | ORAL | Status: DC

## 2013-02-21 NOTE — Progress Notes (Signed)
Pre visit review using our clinic review tool, if applicable. No additional management support is needed unless otherwise documented below in the visit note. 

## 2013-02-21 NOTE — Patient Instructions (Addendum)
It is very good to see you.  You appear to be very fit, as always.  Your blood pressure is high today and it sounds like it has been running a bit high at home. Plan Increased diovan to 160 mg daily ( you may take 80mg  tablets, two per day, until your supply is gone.) New Rx sent to pharmacy for the 160 mg tablet  We will check routine lab today: kidney function and potassium level. Results will be sent as a letter.  You are given Prevnar pneumonia vaccine today and will receive Pneumovax at your next office visit.

## 2013-02-21 NOTE — Progress Notes (Signed)
Subjective:    Patient ID: Sylvia Villarreal, female    DOB: 08-13-1924, 79 y.o.   MRN: [id number]  HPI Sylvia Villarreal presents for follow up of BP . She has been doing fine. Her BP is high today. She reports that at home SBP has been 150+. She remains active and trim. Her daughter is with her today and her family is very close to her.   Past Medical History  Diagnosis Date  . Osteoporosis, unspecified   . Allergic rhinitis, cause unspecified   . Cramp of limb   . Unspecified closed fracture of carpal bone   . Personal history of malignant neoplasm of breast   . Other and unspecified hyperlipidemia   . Unspecified essential hypertension    Past Surgical History  Procedure Laterality Date  . Appendectomy    . Modified radical mastectomy w/ axillary lymph node dissection    . Abdominal hysterectomy    . Oophorectomy     Family History  Problem Relation Age of Onset  . Alzheimer's disease Mother   . Stroke Father   . Colon cancer Neg Hx   . Breast cancer Neg Hx   . Diabetes Neg Hx    History   Social History  . Marital Status: Married    Spouse Name: N/A    Number of Children: N/A  . Years of Education: N/A   Occupational History  . Not on file.   Social History Main Topics  . Smoking status: Never Smoker   . Smokeless tobacco: Not on file  . Alcohol Use: No  . Drug Use: No  . Sexual Activity: Not on file   Other Topics Concern  . 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 Married - '50 - widowed 10/09. 2 daughters; 5 grandchildren;several great grandchildren. Lives alone and remains independent.  full life alert system in the home. She has bought her burial plot.  ACP - full code and support. HCPOA - Pixie [place] (c) [phone]; Megan Mans. Woodard [phone] ; Bettey [phone]           Current Outpatient Prescriptions on File Prior to Visit  Medication Sig Dispense Refill  . ibuprofen (ADVIL,MOTRIN)  200 MG tablet Take 200 mg by mouth as needed.         No current facility-administered medications on file prior to visit.      Review of Systems Constitutional:  Negative for fever, chills, activity change and unexpected weight change.  HEENT:  Negative for hearing loss, ear pain, congestion, neck stiffness and postnasal drip. Negative for sore throat or swallowing problems. Negative for dental complaints.   Eyes: Negative for vision loss or change in visual acuity.  Respiratory: Negative for chest tightness and wheezing. Negative for DOE.   Cardiovascular: Negative for chest pain or palpitations. No decreased exercise tolerance Gastrointestinal: No change in bowel habit. No bloating or gas. No reflux or indigestion Genitourinary: Negative for urgency, frequency, flank pain and difficulty urinating.  Musculoskeletal: Negative for myalgias, back pain, arthralgias and gait problem.  Neurological: Negative for dizziness, tremors, weakness and headaches.  Hematological: Negative for adenopathy.  Psychiatric/Behavioral: Negative for behavioral problems and dysphoric mood.       Objective:   Physical Exam Filed Vitals:   02/21/13 1032  BP: 182/90  Pulse: 69  Temp: 97.8 F (36.6 C)   Wt Readings from Last 3 Encounters:  02/21/13 110 lb 6.4  oz (50.077 kg)  08/04/12 110 lb (49.896 kg)  06/05/10 123 lb (55.792 kg)   BP Readings from Last 3 Encounters:  02/21/13 182/90  08/04/12 140/80  06/05/10 148/88   Gen'l - a very young looking 79 y/o who is very vigorous, bright and cheerful HEENT - C&S clear, PERRLA Cor- 2+ radial pulse, RRR Pulm - normal respirations MSK - no deformities or abnormalities Neuro - Bright, A&O x 3, normal facial symmetry, nl gait and station.  Recent Results (from the past 2160 hour(s))  BASIC METABOLIC PANEL     Status: Abnormal   Collection Time    02/21/13 11:24 AM      Result Value Range   Sodium 140  135 - 145 mEq/L   Potassium 4.0  3.5 - 5.1  mEq/L   Chloride 104  96 - 112 mEq/L   CO2 27  19 - 32 mEq/L   Glucose, Bld 86  70 - 99 mg/dL   BUN 21  6 - 23 mg/dL   Creatinine, Ser 1.2  0.4 - 1.2 mg/dL   Calcium 9.1  8.4 - 16.1 mg/dL   GFR 09.60 (*) >45.40 mL/min          Assessment & Plan:

## 2013-02-23 NOTE — Assessment & Plan Note (Signed)
She is doing very well. She has a bright affect. She reports she is very active and daughter confirms this. She doesn't drive and does have a lot of supervision.

## 2013-02-23 NOTE — Assessment & Plan Note (Signed)
Your blood pressure is high today and it sounds like it has been running a bit high at home.  Plan Increased diovan to 160 mg daily ( you may take 80mg  tablets, two per day, until your supply is gone.) New Rx sent to pharmacy for the 160 mg tablet

## 2013-02-25 ENCOUNTER — Encounter: Payer: Self-pay | Admitting: Internal Medicine

## 2013-08-18 ENCOUNTER — Other Ambulatory Visit: Payer: Self-pay

## 2013-08-18 ENCOUNTER — Telehealth: Payer: Self-pay | Admitting: Internal Medicine

## 2013-08-18 MED ORDER — VALSARTAN 160 MG PO TABS: 160.0000 mg | ORAL_TABLET | Freq: Every day | ORAL | Status: DC

## 2013-08-18 NOTE — Telephone Encounter (Signed)
Rx was filled by Dr. Linda Hedges 01/2013 for #90 with 3 refills. Pt should have 2 refills left

## 2013-08-18 NOTE — Telephone Encounter (Signed)
Pt is scheduled to see Padonda on 09/09/13.   She needs a re-fill on valsartan (DIOVAN) 160 MG tablet, sent to [facility] [place], [place] - [address]

## 2013-08-18 NOTE — Telephone Encounter (Signed)
Pt has been filling the old RX valsartan 80 mg and only taking one per day.  Dr. Linda Hedges increased the medication to 160 mg and advised the pt to take 2 - 80 mg per day until the medication runs out.  Pt never picked-up the 160 mg.  Pt and daughter Hassan Rowan is now aware to pick-up the 160 mg at the pharmacy and it is ready.  I called [facility] to verify the RX was there and available for pick-up.

## 2013-08-18 NOTE — Telephone Encounter (Signed)
Noted  

## 2013-09-19 ENCOUNTER — Ambulatory Visit (INDEPENDENT_AMBULATORY_CARE_PROVIDER_SITE_OTHER): Payer: Medicare Other | Admitting: Family

## 2013-09-19 ENCOUNTER — Encounter: Payer: Self-pay | Admitting: Family

## 2013-09-19 VITALS — BP 148/90 | HR 92 | Ht 60.75 in | Wt 106.0 lb

## 2013-09-19 DIAGNOSIS — M810 Age-related osteoporosis without current pathological fracture: Secondary | ICD-10-CM | POA: Diagnosis not present

## 2013-09-19 DIAGNOSIS — E785 Hyperlipidemia, unspecified: Secondary | ICD-10-CM

## 2013-09-19 DIAGNOSIS — I10 Essential (primary) hypertension: Secondary | ICD-10-CM | POA: Diagnosis not present

## 2013-09-19 MED ORDER — VALSARTAN 160 MG PO TABS: 160.0000 mg | ORAL_TABLET | Freq: Every day | ORAL | Status: DC

## 2013-09-19 NOTE — Progress Notes (Signed)
Pre visit review using our clinic review tool, if applicable. No additional management support is needed unless otherwise documented below in the visit note. 

## 2013-09-19 NOTE — Progress Notes (Signed)
Subjective:    Patient ID: Sylvia Villarreal, female    DOB: Nov 18, 1924, 80 y.o.   MRN: [id number]  HPI 80 year old African American female, former Automotive engineer, transferring with a history of hypertension, hyperlipidemia, osteoporosis. She currently takes Diovan and tolerated it well. Reports checking her blood pressure at home is typically between 665 and 993 systolically. She walks daily.   Review of Systems  Constitutional: Negative.   Respiratory: Negative.   Cardiovascular: Negative.   Gastrointestinal: Negative.   Endocrine: Negative.   Musculoskeletal: Negative.   Skin: Negative.   Allergic/Immunologic: Negative.   Psychiatric/Behavioral: Negative.    Past Medical History  Diagnosis Date  . Osteoporosis, unspecified   . Allergic rhinitis, cause unspecified   . Cramp of limb   . Unspecified closed fracture of carpal bone   . Personal history of malignant neoplasm of breast   . Other and unspecified hyperlipidemia   . Unspecified essential hypertension     History   Social History  . Marital Status: Married    Spouse Name: N/A    Number of Children: N/A  . Years of Education: N/A   Occupational History  . Not on file.   Social History Main Topics  . Smoking status: Never Smoker   . Smokeless tobacco: Not on file  . Alcohol Use: No  . Drug Use: No  . Sexual Activity: Not on file   Other Topics Concern  . 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 2 daughters; 5 grandchildren;several great grandchildren. Lives alone and remains independent.  full life alert system in the home. She has bought her burial plot.  ACP - full code and support. [place] (c) [phone]; Nobie Putnam. [place] ([phone] Brent [phone]          Past Surgical History  Procedure Laterality Date  . Appendectomy    . Modified radical mastectomy w/ axillary lymph node  dissection    . Abdominal hysterectomy    . Oophorectomy      Family History  Problem Relation Age of Onset  . Alzheimer's disease Mother   . Stroke Father   . Colon cancer Neg Hx   . Breast cancer Neg Hx   . Diabetes Neg Hx     Allergies  Allergen Reactions  . Sulfonamide Derivatives     Current Outpatient Prescriptions on File Prior to Visit  Medication Sig Dispense Refill  . ibuprofen (ADVIL,MOTRIN) 200 MG tablet Take 200 mg by mouth as needed.         No current facility-administered medications on file prior to visit.    BP 148/90  Pulse 92  Ht 5' 0.75" (1.543 m)  Wt 106 lb (48.081 kg)  BMI 20.19 kg/m2chart    Objective:   Physical Exam  Constitutional: She is oriented to person, place, and time. She appears well-developed and well-nourished.  Neck: Normal range of motion. Neck supple.  Cardiovascular: Normal rate, regular rhythm and normal heart sounds.   140/76  Pulmonary/Chest: Effort normal and breath sounds normal.  Abdominal: Soft. Bowel sounds are normal.  Musculoskeletal: Normal range of motion.  Neurological: She is alert and oriented to person, place, and time.  Skin: Skin is warm and dry.  Psychiatric: She has a normal mood and affect.          Assessment & Plan:  Sylvia Villarreal was seen  today for establish care.  Diagnoses and associated orders for this visit:  Unspecified essential hypertension  Other and unspecified hyperlipidemia  Osteoporosis, unspecified  Other Orders - valsartan (DIOVAN) 160 MG tablet; Take 1 tablet (160 mg total) by mouth daily.   Call the office with any questions or concerns. Recheck in 6 months and sooner as needed.

## 2013-09-19 NOTE — Patient Instructions (Signed)

## 2014-01-20 ENCOUNTER — Emergency Department (HOSPITAL_COMMUNITY): Payer: Medicare Other

## 2014-01-20 ENCOUNTER — Encounter (HOSPITAL_COMMUNITY): Payer: Self-pay | Admitting: *Deleted

## 2014-01-20 ENCOUNTER — Inpatient Hospital Stay (HOSPITAL_COMMUNITY): Payer: Medicare Other

## 2014-01-20 ENCOUNTER — Inpatient Hospital Stay (HOSPITAL_COMMUNITY)
Admission: EM | Admit: 2014-01-20 | Discharge: 2014-01-23 | DRG: 683 | Disposition: A | Discharge status: Skilled Nursing Facility | Payer: Medicare Other | Attending: Internal Medicine | Admitting: Internal Medicine

## 2014-01-20 DIAGNOSIS — Z882 Allergy status to sulfonamides status: Secondary | ICD-10-CM | POA: Diagnosis not present

## 2014-01-20 DIAGNOSIS — M6282 Rhabdomyolysis: Secondary | ICD-10-CM | POA: Diagnosis present

## 2014-01-20 DIAGNOSIS — R531 Weakness: Secondary | ICD-10-CM | POA: Diagnosis present

## 2014-01-20 DIAGNOSIS — R2990 Unspecified symptoms and signs involving the nervous system: Secondary | ICD-10-CM | POA: Diagnosis present

## 2014-01-20 DIAGNOSIS — R2681 Unsteadiness on feet: Secondary | ICD-10-CM | POA: Diagnosis not present

## 2014-01-20 DIAGNOSIS — R41841 Cognitive communication deficit: Secondary | ICD-10-CM | POA: Diagnosis not present

## 2014-01-20 DIAGNOSIS — Z8673 Personal history of transient ischemic attack (TIA), and cerebral infarction without residual deficits: Secondary | ICD-10-CM

## 2014-01-20 DIAGNOSIS — S32509D Unspecified fracture of unspecified pubis, subsequent encounter for fracture with routine healing: Secondary | ICD-10-CM | POA: Diagnosis not present

## 2014-01-20 DIAGNOSIS — R51 Headache: Secondary | ICD-10-CM | POA: Diagnosis not present

## 2014-01-20 DIAGNOSIS — R410 Disorientation, unspecified: Secondary | ICD-10-CM | POA: Diagnosis not present

## 2014-01-20 DIAGNOSIS — M79605 Pain in left leg: Secondary | ICD-10-CM | POA: Diagnosis not present

## 2014-01-20 DIAGNOSIS — Z7982 Long term (current) use of aspirin: Secondary | ICD-10-CM

## 2014-01-20 DIAGNOSIS — I369 Nonrheumatic tricuspid valve disorder, unspecified: Secondary | ICD-10-CM | POA: Diagnosis not present

## 2014-01-20 DIAGNOSIS — I6330 Cerebral infarction due to thrombosis of unspecified cerebral artery: Secondary | ICD-10-CM | POA: Diagnosis not present

## 2014-01-20 DIAGNOSIS — I6523 Occlusion and stenosis of bilateral carotid arteries: Secondary | ICD-10-CM | POA: Diagnosis present

## 2014-01-20 DIAGNOSIS — D72829 Elevated white blood cell count, unspecified: Secondary | ICD-10-CM | POA: Diagnosis present

## 2014-01-20 DIAGNOSIS — E861 Hypovolemia: Secondary | ICD-10-CM | POA: Diagnosis present

## 2014-01-20 DIAGNOSIS — R29898 Other symptoms and signs involving the musculoskeletal system: Secondary | ICD-10-CM | POA: Diagnosis present

## 2014-01-20 DIAGNOSIS — Z853 Personal history of malignant neoplasm of breast: Secondary | ICD-10-CM

## 2014-01-20 DIAGNOSIS — Z823 Family history of stroke: Secondary | ICD-10-CM

## 2014-01-20 DIAGNOSIS — N179 Acute kidney failure, unspecified: Secondary | ICD-10-CM | POA: Diagnosis present

## 2014-01-20 DIAGNOSIS — W19XXXA Unspecified fall, initial encounter: Secondary | ICD-10-CM | POA: Diagnosis present

## 2014-01-20 DIAGNOSIS — S32599A Other specified fracture of unspecified pubis, initial encounter for closed fracture: Secondary | ICD-10-CM

## 2014-01-20 DIAGNOSIS — I639 Cerebral infarction, unspecified: Secondary | ICD-10-CM

## 2014-01-20 DIAGNOSIS — S32592A Other specified fracture of left pubis, initial encounter for closed fracture: Secondary | ICD-10-CM | POA: Diagnosis present

## 2014-01-20 DIAGNOSIS — R1311 Dysphagia, oral phase: Secondary | ICD-10-CM | POA: Diagnosis not present

## 2014-01-20 DIAGNOSIS — Z79899 Other long term (current) drug therapy: Secondary | ICD-10-CM

## 2014-01-20 DIAGNOSIS — M810 Age-related osteoporosis without current pathological fracture: Secondary | ICD-10-CM | POA: Diagnosis present

## 2014-01-20 DIAGNOSIS — E785 Hyperlipidemia, unspecified: Secondary | ICD-10-CM | POA: Diagnosis present

## 2014-01-20 DIAGNOSIS — Z820 Family history of epilepsy and other diseases of the nervous system: Secondary | ICD-10-CM

## 2014-01-20 DIAGNOSIS — K5900 Constipation, unspecified: Secondary | ICD-10-CM | POA: Diagnosis not present

## 2014-01-20 DIAGNOSIS — S32502A Unspecified fracture of left pubis, initial encounter for closed fracture: Secondary | ICD-10-CM | POA: Diagnosis not present

## 2014-01-20 DIAGNOSIS — I10 Essential (primary) hypertension: Secondary | ICD-10-CM | POA: Diagnosis present

## 2014-01-20 DIAGNOSIS — S32501A Unspecified fracture of right pubis, initial encounter for closed fracture: Secondary | ICD-10-CM | POA: Diagnosis not present

## 2014-01-20 DIAGNOSIS — Z791 Long term (current) use of non-steroidal anti-inflammatories (NSAID): Secondary | ICD-10-CM

## 2014-01-20 DIAGNOSIS — Z9010 Acquired absence of unspecified breast and nipple: Secondary | ICD-10-CM | POA: Diagnosis present

## 2014-01-20 DIAGNOSIS — I672 Cerebral atherosclerosis: Secondary | ICD-10-CM | POA: Diagnosis present

## 2014-01-20 DIAGNOSIS — E876 Hypokalemia: Secondary | ICD-10-CM | POA: Diagnosis present

## 2014-01-20 DIAGNOSIS — M6281 Muscle weakness (generalized): Secondary | ICD-10-CM

## 2014-01-20 DIAGNOSIS — N281 Cyst of kidney, acquired: Secondary | ICD-10-CM | POA: Diagnosis present

## 2014-01-20 DIAGNOSIS — F0390 Unspecified dementia without behavioral disturbance: Secondary | ICD-10-CM | POA: Diagnosis not present

## 2014-01-20 DIAGNOSIS — Y92009 Unspecified place in unspecified non-institutional (private) residence as the place of occurrence of the external cause: Secondary | ICD-10-CM | POA: Diagnosis not present

## 2014-01-20 DIAGNOSIS — Z90710 Acquired absence of both cervix and uterus: Secondary | ICD-10-CM

## 2014-01-20 DIAGNOSIS — R278 Other lack of coordination: Secondary | ICD-10-CM | POA: Diagnosis not present

## 2014-01-20 DIAGNOSIS — S32512A Fracture of superior rim of left pubis, initial encounter for closed fracture: Secondary | ICD-10-CM | POA: Diagnosis not present

## 2014-01-20 LAB — URINALYSIS, ROUTINE W REFLEX MICROSCOPIC
Bilirubin Urine: NEGATIVE
GLUCOSE, UA: NEGATIVE mg/dL
KETONES UR: 15 mg/dL — AB
Leukocytes, UA: NEGATIVE
Nitrite: NEGATIVE
Protein, ur: 30 mg/dL — AB
Specific Gravity, Urine: 1.015 (ref 1.005–1.030)
Urobilinogen, UA: 0.2 mg/dL (ref 0.0–1.0)
pH: 5 (ref 5.0–8.0)

## 2014-01-20 LAB — COMPREHENSIVE METABOLIC PANEL
ALK PHOS: 78 U/L (ref 39–117)
ALT: 26 U/L (ref 0–35)
AST: 51 U/L — AB (ref 0–37)
Albumin: 4.3 g/dL (ref 3.5–5.2)
Anion gap: 16 — ABNORMAL HIGH (ref 5–15)
BUN: 39 mg/dL — ABNORMAL HIGH (ref 6–23)
CO2: 23 meq/L (ref 19–32)
Calcium: 9.6 mg/dL (ref 8.4–10.5)
Chloride: 99 mEq/L (ref 96–112)
Creatinine, Ser: 3.26 mg/dL — ABNORMAL HIGH (ref 0.50–1.10)
GFR calc Af Amer: 14 mL/min — ABNORMAL LOW (ref >90)
GFR, EST NON AFRICAN AMERICAN: 12 mL/min — AB (ref >90)
Glucose, Bld: 109 mg/dL — ABNORMAL HIGH (ref 70–99)
POTASSIUM: 4.6 meq/L (ref 3.7–5.3)
SODIUM: 138 meq/L (ref 137–147)
Total Bilirubin: 0.9 mg/dL (ref 0.3–1.2)
Total Protein: 7.7 g/dL (ref 6.0–8.3)

## 2014-01-20 LAB — RAPID URINE DRUG SCREEN, HOSP PERFORMED
AMPHETAMINES: NOT DETECTED
BENZODIAZEPINES: NOT DETECTED
Barbiturates: NOT DETECTED
Cocaine: NOT DETECTED
OPIATES: NOT DETECTED
Tetrahydrocannabinol: NOT DETECTED

## 2014-01-20 LAB — CK TOTAL AND CKMB (NOT AT ARMC)
CK, MB: 22.7 ng/mL (ref 0.3–4.0)
RELATIVE INDEX: 1.4 (ref 0.0–2.5)
Total CK: 1575 U/L — ABNORMAL HIGH (ref 7–177)

## 2014-01-20 LAB — I-STAT TROPONIN, ED: Troponin i, poc: 0.01 ng/mL (ref 0.00–0.08)

## 2014-01-20 LAB — DIFFERENTIAL
Basophils Absolute: 0 10*3/uL (ref 0.0–0.1)
Basophils Relative: 0 % (ref 0–1)
EOS PCT: 0 % (ref 0–5)
Eosinophils Absolute: 0 10*3/uL (ref 0.0–0.7)
LYMPHS PCT: 8 % — AB (ref 12–46)
Lymphs Abs: 1.1 10*3/uL (ref 0.7–4.0)
Monocytes Absolute: 1.3 10*3/uL — ABNORMAL HIGH (ref 0.1–1.0)
Monocytes Relative: 9 % (ref 3–12)
NEUTROS PCT: 83 % — AB (ref 43–77)
Neutro Abs: 12.2 10*3/uL — ABNORMAL HIGH (ref 1.7–7.7)

## 2014-01-20 LAB — URINE MICROSCOPIC-ADD ON

## 2014-01-20 LAB — CBC
HCT: 29.6 % — ABNORMAL LOW (ref 36.0–46.0)
Hemoglobin: 10.2 g/dL — ABNORMAL LOW (ref 12.0–15.0)
MCH: 30.8 pg (ref 26.0–34.0)
MCHC: 34.5 g/dL (ref 30.0–36.0)
MCV: 89.4 fL (ref 78.0–100.0)
PLATELETS: 155 10*3/uL (ref 150–400)
RBC: 3.31 MIL/uL — AB (ref 3.87–5.11)
RDW: 12.4 % (ref 11.5–15.5)
WBC: 14.6 10*3/uL — ABNORMAL HIGH (ref 4.0–10.5)

## 2014-01-20 LAB — PROTIME-INR
INR: 1.18 (ref 0.00–1.49)
Prothrombin Time: 15.2 seconds (ref 11.6–15.2)

## 2014-01-20 LAB — APTT: APTT: 28 s (ref 24–37)

## 2014-01-20 MED ORDER — HEPARIN SODIUM (PORCINE) 5000 UNIT/ML IJ SOLN
5000.0000 [IU] | Freq: Three times a day (TID) | INTRAMUSCULAR | Status: DC
  Administered 2014-01-20 – 2014-01-23 (×9): 5000 [IU] via SUBCUTANEOUS
  Filled 2014-01-20 (×9): qty 1

## 2014-01-20 MED ORDER — STROKE: EARLY STAGES OF RECOVERY BOOK
Freq: Once | Status: AC
  Administered 2014-01-20: 21:00:00
  Filled 2014-01-20: qty 1

## 2014-01-20 MED ORDER — SODIUM CHLORIDE 0.9 % IV SOLN
INTRAVENOUS | Status: AC
  Administered 2014-01-20: 19:00:00 via INTRAVENOUS

## 2014-01-20 MED ORDER — SENNOSIDES-DOCUSATE SODIUM 8.6-50 MG PO TABS
1.0000 | ORAL_TABLET | Freq: Every evening | ORAL | Status: DC | PRN
  Administered 2014-01-20: 1 via ORAL

## 2014-01-20 MED ORDER — ASPIRIN 325 MG PO TABS
325.0000 mg | ORAL_TABLET | Freq: Every day | ORAL | Status: DC
  Administered 2014-01-20 – 2014-01-23 (×4): 325 mg via ORAL
  Filled 2014-01-20 (×4): qty 1

## 2014-01-20 MED ORDER — ASPIRIN EC 81 MG PO TBEC: 81.0000 mg | DELAYED_RELEASE_TABLET | Freq: Every day | ORAL | Status: DC

## 2014-01-20 MED ORDER — SODIUM CHLORIDE 0.9 % IV SOLN
INTRAVENOUS | Status: DC
  Administered 2014-01-21: 1000 mL via INTRAVENOUS

## 2014-01-20 MED ORDER — ASPIRIN 300 MG RE SUPP: 300.0000 mg | Freq: Every day | RECTAL | Status: DC

## 2014-01-20 NOTE — ED Notes (Signed)
Returned from [facility], vitals repeated and taken to [facility]

## 2014-01-20 NOTE — ED Notes (Signed)
MD at bedside. 

## 2014-01-20 NOTE — ED Notes (Signed)
Patient transported to CT 

## 2014-01-20 NOTE — H&P (Addendum)
Patient Demographics  Sylvia Villarreal, is a 80 y.o. female  MRN: [id number]   DOB - 06-21-1924  Admit Date - 01/20/2014  Outpatient Primary MD for the patient is CAMPBELL, PADONDA BOYD, FNP   With History of -  Past Medical History  Diagnosis Date  . Osteoporosis, unspecified   . Allergic rhinitis, cause unspecified   . Cramp of limb   . Unspecified closed fracture of carpal bone   . Personal history of malignant neoplasm of breast   . Other and unspecified hyperlipidemia   . Unspecified essential hypertension       Past Surgical History  Procedure Laterality Date  . Appendectomy    . Modified radical mastectomy w/ axillary lymph node dissection    . Abdominal hysterectomy    . Oophorectomy      in for   Chief Complaint  Patient presents with  . Weakness     HPI  Sylvia Villarreal  is a 80 y.o. female, with significant past medical history of hypertension, remote CVA in the past without residual deficits, osteoporosis, cognitive impairment/dementia, breast cancer status post mastectomy, patient lives home by herself, with close family supervision and visits. Patient is fairly active, with active lifestyle, walks 2 miles per day as per family, family couldn't get in touch with her this morning, so they want to see her at home, they found her sitting on the couch, unable to get up on his own, she was limping, complaining of left lower extremity weakness, patient has dementia, confused, her history is not very reliable, she reports he was taking her trash yesterday, where she had some weakness, she denies any atelectasis, no left upper extremity weakness, no visual problem, no facial droop, no slurred speech. As well patient was noticed to be in acute renal failure, baseline creatinine is 1.4, today's appear to be 3.4, patient reports she is taking Aleve at home, and she is on ACE inhibitor as well.    Review of Systems    In addition to the HPI above,  No Fever-chills, No  Headache, No changes with Vision or hearing, No problems swallowing food or Liquids, No Chest pain, Cough or Shortness of Breath, No Abdominal pain, No Nausea or Vommitting, Bowel movements are regular, No Blood in stool or Urine, No dysuria, No new skin rashes or bruises, No new joints pains-aches,  Has left lower extremity weakness, tingling, numbness in any extremity, No recent weight gain or loss, No polyuria, polydypsia or polyphagia, No significant Mental Stressors.  A full 10 point Review of Systems was done, except as stated above, all other Review of Systems were negative.   Social History History  Substance Use Topics  . Smoking status: Never Smoker   . Smokeless tobacco: Not on file  . Alcohol Use: No     Family History Family History  Problem Relation Age of Onset  . Alzheimer's disease Mother   . Stroke Father   . Colon cancer Neg Hx   . Breast cancer Neg Hx   . Diabetes Neg Hx      Prior to Admission medications   Medication Sig Start Date End Date Taking? Authorizing Provider  ibuprofen (ADVIL,MOTRIN) 200 MG tablet Take 200 mg by mouth as needed.     Yes Historical Provider, MD  valsartan (DIOVAN) 160 MG tablet Take 1 tablet (160 mg total) by mouth daily. 09/19/13  Yes Timoteo Gaul, FNP    Allergies  Allergen Reactions  . Sulfonamide Derivatives  Other (See Comments)    Unknown    Physical Exam  Vitals  Blood pressure 159/68, pulse 101, temperature 98.7 F (37.1 C), temperature source Oral, resp. rate 15, height 5\' 1"  (1.549 m), weight 47.628 kg (105 lb), SpO2 100 %.   1. General elderly female lying in bed in NAD,    2. Patient is confused secondary to dementia, has impaired judgment and cognition,  but is Awake Alert, Oriented X 3.  3. No F.N deficits, ALL C.Nerves Intact, left lower extremity weakness 3/5  4. Ears and Eyes appear Normal, Conjunctivae clear, PERRLA. Moist Oral Mucosa.  5. Supple Neck, No JVD, No cervical  lymphadenopathy appriciated, No Carotid Bruits.  6. Symmetrical Chest wall movement, Good air movement bilaterally, CTAB.  7. RRR, No Gallops, Rubs or Murmurs, No Parasternal Heave.  8. Positive Bowel Sounds, Abdomen Soft, No tenderness, No organomegaly appriciated,No rebound -guarding or rigidity.  9.  No Cyanosis, Normal Skin Turgor, No Skin Rash or Bruise.  10. Good muscle tone,  joints appear normal , no effusions, Normal ROM.  11. No Palpable Lymph Nodes in Neck or Axillae    Data Review  CBC  Recent Labs Lab 01/20/14 1150  WBC 14.6*  HGB 10.2*  HCT 29.6*  PLT 155  MCV 89.4  MCH 30.8  MCHC 34.5  RDW 12.4  LYMPHSABS 1.1  MONOABS 1.3*  EOSABS 0.0  BASOSABS 0.0   ------------------------------------------------------------------------------------------------------------------  Chemistries   Recent Labs Lab 01/20/14 1150  NA 138  K 4.6  CL 99  CO2 23  GLUCOSE 109*  BUN 39*  CREATININE 3.26*  CALCIUM 9.6  AST 51*  ALT 26  ALKPHOS 78  BILITOT 0.9   ------------------------------------------------------------------------------------------------------------------ estimated creatinine clearance is 9.1 mL/min (by C-G formula based on Cr of 3.26). ------------------------------------------------------------------------------------------------------------------ No results for input(s): TSH, T4TOTAL, T3FREE, THYROIDAB in the last 72 hours.  Invalid input(s): FREET3   Coagulation profile  Recent Labs Lab 01/20/14 1150  INR 1.18   ------------------------------------------------------------------------------------------------------------------- No results for input(s): DDIMER in the last 72 hours. -------------------------------------------------------------------------------------------------------------------  Cardiac Enzymes No results for input(s): CKMB, TROPONINI, MYOGLOBIN in the last 168 hours.  Invalid input(s):  CK ------------------------------------------------------------------------------------------------------------------ Invalid input(s): POCBNP   ---------------------------------------------------------------------------------------------------------------  Urinalysis No results found for: COLORURINE, APPEARANCEUR, LABSPEC, PHURINE, GLUCOSEU, HGBUR, BILIRUBINUR, KETONESUR, PROTEINUR, UROBILINOGEN, NITRITE, LEUKOCYTESUR  ----------------------------------------------------------------------------------------------------------------  Imaging results:   Ct Head Wo Contrast  01/20/2014   CLINICAL DATA:  Left leg weakness, difficulty ambulating, dementia, confusion  EXAM: CT HEAD WITHOUT CONTRAST  TECHNIQUE: Contiguous axial images were obtained from the base of the skull through the vertex without intravenous contrast.  COMPARISON:  None.  FINDINGS: No evidence of parenchymal hemorrhage or extra-axial fluid collection. No mass lesion, mass effect, or midline shift.  No CT evidence of acute infarction.  Subcortical white matter and periventricular small vessel ischemic changes. Intracranial atherosclerosis.  Global cortical atrophy.  Secondary mild ventricular prominence.  The visualized paranasal sinuses are essentially clear. Partial opacification of the right mastoid air cells.  No evidence of calvarial fracture.  IMPRESSION: No evidence of acute intracranial abnormality.  Atrophy with small vessel ischemic changes and intracranial atherosclerosis.   Electronically Signed   By: Julian Hy M.D.   On: 01/20/2014 12:41        Assessment & Plan  Principal Problem:   Left leg weakness Active Problems:   Essential hypertension   Stroke-like symptoms   Acute renal failure    Left lower extremity weakness/suspicion for CVA: -Patient will be started  on aspirin, will CVA workup as per neuro recommendation, will obtain MRI/MRA of the brain without contrast, check hemoglobin A1c, fasting  lipid panel, echocardiogram, carotid Doppler,. -PT/OT/swallow evaluation consult  Acute renal failure -Patient is on NSAIDs, and ACE inhibitor, is likely contributing to her renal failure. -We will check total CK to be sure patient is not having abdominal lysis as unclear if patient was on the floor for any period of time. -We will check bladder scan to be sure no retention contributing to it -We'll continue with IV fluid  Hypertension -Hold medication to allow for permissive hypertension in case it is CVA.  Dementia  Leukocytosis -Patient is non febrile, denies any cough, urinalysis pending.   DVT Prophylaxis Heparin -   AM Labs Ordered, also please review Full Orders  Family Communication: Admission, patients condition and plan of care including tests being ordered have been discussed with the patient and both daughters and grandaughter at bedside who indicate understanding and agree with the plan and Code Status.  Code Status full  Disposition: Admit to telemetry  Condition GUARDED    Time spent in minutes : 60 minutes    Amsi Grimley M.D on 01/20/2014 at 3:38 PM  Between 7am to 7pm - Pager - [phone]  After 7pm go to [email or web address] - password [email or web address]  And look for the night coverage person covering me after hours  [facility] Office  [phone]   **Disclaimer: This note may have been dictated with voice recognition software. Similar sounding words can inadvertently be transcribed and this note may contain transcription errors which may not have been corrected upon publication of note.**

## 2014-01-20 NOTE — ED Provider Notes (Signed)
CSN: [id number]     Arrival date & time 01/20/14  1136 History   First MD Initiated Contact with Patient 01/20/14 1154     Chief Complaint  Patient presents with  . Weakness   HPI  Patient is a 80 year old female with past medical history of remote CVA who presents to the emergency room with left leg weakness. Per the patient's daughter and granddaughter the patient was not answering the phone this morning so they went over to check on her. When they got there the patient was more confused than normal and was unable to stand up on her own. This is very unusual for the patient as she normally walks 2 miles a day. Patient has been complaining of headaches recently. Per the family last normal behavior and gait pattern was Thursday. Patient did go for her morning walk yesterday morning.  Past Medical History  Diagnosis Date  . Osteoporosis, unspecified   . Allergic rhinitis, cause unspecified   . Cramp of limb   . Unspecified closed fracture of carpal bone   . Personal history of malignant neoplasm of breast   . Other and unspecified hyperlipidemia   . Unspecified essential hypertension    Past Surgical History  Procedure Laterality Date  . Appendectomy    . Modified radical mastectomy w/ axillary lymph node dissection    . Abdominal hysterectomy    . Oophorectomy     Family History  Problem Relation Age of Onset  . Alzheimer's disease Mother   . Stroke Father   . Colon cancer Neg Hx   . Breast cancer Neg Hx   . Diabetes Neg Hx    History  Substance Use Topics  . Smoking status: Never Smoker   . Smokeless tobacco: Not on file  . Alcohol Use: No   OB History    No data available     Review of Systems  Constitutional: Negative for fever, chills and fatigue.  Respiratory: Negative for chest tightness and shortness of breath.   Cardiovascular: Negative for chest pain, palpitations and leg swelling.  Musculoskeletal: Positive for gait problem.  Skin: Negative for rash.   Neurological: Positive for weakness and headaches. Negative for dizziness, tremors, speech difficulty and numbness.  Psychiatric/Behavioral: Positive for confusion.  All other systems reviewed and are negative.     Allergies  Sulfonamide derivatives  Home Medications   Prior to Admission medications   Medication Sig Start Date End Date Taking? Authorizing Provider  ibuprofen (ADVIL,MOTRIN) 200 MG tablet Take 200 mg by mouth as needed.     Yes Historical Provider, MD  valsartan (DIOVAN) 160 MG tablet Take 1 tablet (160 mg total) by mouth daily. 09/19/13  Yes Timoteo Gaul, FNP   BP 166/72 mmHg  Pulse 88  Temp(Src) 98.7 F (37.1 C) (Oral)  Resp 19  Ht 5\' 1"  (1.549 m)  Wt 105 lb (47.628 kg)  BMI 19.85 kg/m2  SpO2 100% Physical Exam  Constitutional: She is oriented to person, place, and time. She appears well-developed and well-nourished. No distress.  HENT:  Head: Normocephalic and atraumatic.  Mouth/Throat: Oropharynx is clear and moist. No oropharyngeal exudate.  Eyes: Conjunctivae and EOM are normal. Pupils are equal, round, and reactive to light. No scleral icterus.  Neck: Normal range of motion. Neck supple. No JVD present. No thyromegaly present.  Cardiovascular: Normal rate, regular rhythm, normal heart sounds and intact distal pulses.  Exam reveals no gallop and no friction rub.   No murmur heard. Pulmonary/Chest: Effort  normal and breath sounds normal. No respiratory distress. She has no wheezes. She has no rales. She exhibits no tenderness.  Abdominal: Soft. Bowel sounds are normal. She exhibits no distension and no mass. There is no tenderness. There is no rebound and no guarding.  Lymphadenopathy:    She has no cervical adenopathy.  Neurological: She is alert and oriented to person, place, and time. No cranial nerve deficit or sensory deficit. Coordination normal. GCS eye subscore is 4. GCS verbal subscore is 5. GCS motor subscore is 6.  There is left leg  weakness with hip flexion, knee flexion, knee extension. This weakness is near paralysis. Negative pronator drift. Normal rapid alternating movements.  Skin: Skin is warm and dry. She is not diaphoretic.  Psychiatric: She has a normal mood and affect. Her behavior is normal. Judgment and thought content normal.  Nursing note and vitals reviewed.   ED Course  Procedures (including critical care time) Labs Review Labs Reviewed  CBC - Abnormal; Notable for the following:    WBC 14.6 (*)    RBC 3.31 (*)    Hemoglobin 10.2 (*)    HCT 29.6 (*)    All other components within normal limits  DIFFERENTIAL - Abnormal; Notable for the following:    Neutrophils Relative % 83 (*)    Neutro Abs 12.2 (*)    Lymphocytes Relative 8 (*)    Monocytes Absolute 1.3 (*)    All other components within normal limits  COMPREHENSIVE METABOLIC PANEL - Abnormal; Notable for the following:    Glucose, Bld 109 (*)    BUN 39 (*)    Creatinine, Ser 3.26 (*)    AST 51 (*)    GFR calc non Af Amer 12 (*)    GFR calc Af Amer 14 (*)    Anion gap 16 (*)    All other components within normal limits  PROTIME-INR  APTT  URINE RAPID DRUG SCREEN (HOSP PERFORMED)  URINALYSIS, ROUTINE W REFLEX MICROSCOPIC  CK TOTAL AND CKMB  I-STAT TROPOININ, ED    Imaging Review Ct Head Wo Contrast  01/20/2014   CLINICAL DATA:  Left leg weakness, difficulty ambulating, dementia, confusion  EXAM: CT HEAD WITHOUT CONTRAST  TECHNIQUE: Contiguous axial images were obtained from the base of the skull through the vertex without intravenous contrast.  COMPARISON:  None.  FINDINGS: No evidence of parenchymal hemorrhage or extra-axial fluid collection. No mass lesion, mass effect, or midline shift.  No CT evidence of acute infarction.  Subcortical white matter and periventricular small vessel ischemic changes. Intracranial atherosclerosis.  Global cortical atrophy.  Secondary mild ventricular prominence.  The visualized paranasal sinuses are  essentially clear. Partial opacification of the right mastoid air cells.  No evidence of calvarial fracture.  IMPRESSION: No evidence of acute intracranial abnormality.  Atrophy with small vessel ischemic changes and intracranial atherosclerosis.   Electronically Signed   By: Julian Hy M.D.   On: 01/20/2014 12:41     EKG Interpretation None      MDM   Final diagnoses:  Stroke-like symptoms  Acute renal failure, unspecified acute renal failure type   Patient is an 80 year old female who presents to the emergency room for evaluation of left leg meters. Physical exam reveals near paralysis of the left leg. There are no other focal abnormalities in the neurological exam. CT head is negative. PT/INR is normal. Aptt is normal. I-STAT troponin is negative. CBC reveals leukocytosis. CMP reveals new acute renal failure with mild anion gap  of 16. Patient has been seen by Dr. Audie Pinto who spoke with Dr. Aram Beecham from neurology. Dr. Reeves Forth will see the patient here in the ED. Patient will need admission.  I have spoken with Dr. Waldron Labs who will admit the patient to telemetry at this time.  He has asked that I do a bladder scan here in the ED and if there is a large amount of urine in the bladder he has recommended placing an indwelling Foley catheter here.    Cherylann Parr, PA-C 01/20/14 [place], MD 01/20/14 [phone]

## 2014-01-20 NOTE — Plan of Care (Signed)
Problem: Acute Treatment Outcomes Goal: Airway maintained/protected Outcome: Completed/Met Date Met:  01/20/14     

## 2014-01-20 NOTE — ED Notes (Signed)
Pt in with family stating that they couldn't get in touch her on the phone so they went over, pt was sitting on the couch and was unable to get up on her own, pt normally ambulates two miles a day, pt still unable to walk without assistance, pt with weakness noted to left leg specifically, no weakness noted in arms, pt has history of dementia and is normally confused at times, family reports increased confusion today, pt alert

## 2014-01-20 NOTE — Progress Notes (Signed)
Patient total CK came back significantly elevated at 1575, renal failure most likely related to rhabdomyolysis, increase IV fluid from 50 mL/h to 100 mL/h.

## 2014-01-20 NOTE — Consult Note (Addendum)
Referring Physician: Dr. Audie Pinto    Chief Complaint: left leg weakness  HPI:                                                                                                                                         Sylvia Villarreal is an 80 y.o. female with a past medical history significant for HTN, remote CVA without residual deficits, osteoporosis, breast cancer s/p mastectomy, cognitive impairment, brought in by family for further evaluation of left leg weakness. Never had similar symptom before. Patient is very active and walks two miles on a daily basis, but family stated that they couldn't get in touch with her on the phone so they went over and found her sitting on the couch and was unable to get up on her own. Patient said that yesterday morning when she was moving the trash can she noticed some difficulty with her left leg, had a fall in the afternoon, and the left leg has been week ever since. Has a HA that has been unchanged for quite some time, and denies vertigo, double vision, difficulty swallowing, slurred speech, language or vision impairment.  Some trouble controlling her bladder which is not new. Denies low back or leg pain/numbness. No neck pain. CT brain showed no acute intracranial abnormality. Date last known well: 01/15/14 Time last known well: uncertain tPA Given: no, out of the window   Past Medical History  Diagnosis Date  . Osteoporosis, unspecified   . Allergic rhinitis, cause unspecified   . Cramp of limb   . Unspecified closed fracture of carpal bone   . Personal history of malignant neoplasm of breast   . Other and unspecified hyperlipidemia   . Unspecified essential hypertension     Past Surgical History  Procedure Laterality Date  . Appendectomy    . Modified radical mastectomy w/ axillary lymph node dissection    . Abdominal hysterectomy    . Oophorectomy      Family History  Problem Relation Age of Onset  . Alzheimer's disease Mother   . Stroke  Father   . Colon cancer Neg Hx   . Breast cancer Neg Hx   . Diabetes Neg Hx    Social History:  reports that she has never smoked. She does not have any smokeless tobacco history on file. She reports that she does not drink alcohol or use illicit drugs.  Allergies:  Allergies  Allergen Reactions  . Sulfonamide Derivatives Other (See Comments)    Unknown    Medications:  I have reviewed the patient's current medications.  ROS:                                                                                                                                       History obtained from the patient, family, and chart review.  General ROS: negative for - chills, fatigue, fever, night sweats, weight gain or weight loss Psychological ROS: negative for - behavioral disorder, hallucinations, mood swings or suicidal ideation Ophthalmic ROS: negative for - blurry vision, double vision, eye pain or loss of vision ENT ROS: negative for - epistaxis, nasal discharge, oral lesions, sore throat, tinnitus or vertigo Allergy and Immunology ROS: negative for - hives or itchy/watery eyes Hematological and Lymphatic ROS: negative for - bleeding problems, bruising or swollen lymph nodes Endocrine ROS: negative for - galactorrhea, hair pattern changes, polydipsia/polyuria or temperature intolerance Respiratory ROS: negative for - cough, hemoptysis, shortness of breath or wheezing Cardiovascular ROS: negative for - chest pain, dyspnea on exertion, edema or irregular heartbeat Gastrointestinal ROS: negative for - abdominal pain, diarrhea, hematemesis, nausea/vomiting or stool incontinence Genito-Urinary ROS: negative for - dysuria, hematuria, incontinence or urinary frequency/urgency Musculoskeletal ROS: negative for - joint swelling Neurological ROS: as noted in HPI Dermatological ROS:  negative for rash and skin lesion changes  Physical exam: pleasant female in no apparent distress. Blood pressure 159/68, pulse 101, temperature 97.5 F (36.4 C), temperature source Oral, resp. rate 15, height 5' 1"  (1.549 m), weight 47.628 kg (105 lb), SpO2 100 %. Head: normocephalic. Neck: supple, no bruits, no JVD. Cardiac: no murmurs. Lungs: clear. Abdomen: soft, no tender, no mass. Extremities: no edema. Left leg is internally rotated Neurologic Examination:                                                                                                      General: Mental Status: Alert, oriented, thought content appropriate.  Speech fluent without evidence of aphasia.  Able to follow 3 step commands without difficulty. Cranial Nerves: II: Discs flat bilaterally; Visual fields grossly normal, pupils equal, round, reactive to light and accommodation III,IV, VI: ptosis not present, extra-ocular motions intact bilaterally V,VII: smile symmetric, facial light touch sensation normal bilaterally VIII: hearing normal bilaterally IX,X: gag reflex present XI: bilateral shoulder shrug XII: midline tongue extension without atrophy or fasciculations Motor: Right : Upper extremity   5/5    Left:     Upper extremity   5/5  Lower extremity   5/5  Lower extremity   3/5 Tone and bulk:normal tone throughout; no atrophy noted Sensory: Pinprick and light touch intact throughout, bilaterally Deep Tendon Reflexes:  Right: Upper Extremity   Left: Upper extremity   biceps (C-5 to C-6) 2/4   biceps (C-5 to C-6) 2/4 tricep (C7) 2/4    triceps (C7) 2/4 Brachioradialis (C6) 2/4  Brachioradialis (C6) 2/4  Lower Extremity Lower Extremity  quadriceps (L-2 to L-4) 2/4   quadriceps (L-2 to L-4) 2/4 Achilles (S1) 2/4   Achilles (S1) 2/4  Plantars: Right: downgoing   Left: downgoing Cerebellar: normal finger-to-nose,  normal heel-to-shin test Gait:  Unable to test    Results for orders placed or  performed during the hospital encounter of 01/20/14 (from the past 48 hour(s))  Protime-INR     Status: None   Collection Time: 01/20/14 11:50 AM  Result Value Ref Range   Prothrombin Time 15.2 11.6 - 15.2 seconds   INR 1.18 0.00 - 1.49  APTT     Status: None   Collection Time: 01/20/14 11:50 AM  Result Value Ref Range   aPTT 28 24 - 37 seconds  CBC     Status: Abnormal   Collection Time: 01/20/14 11:50 AM  Result Value Ref Range   WBC 14.6 (H) 4.0 - 10.5 K/uL   RBC 3.31 (L) 3.87 - 5.11 MIL/uL   Hemoglobin 10.2 (L) 12.0 - 15.0 g/dL   HCT 29.6 (L) 36.0 - 46.0 %   MCV 89.4 78.0 - 100.0 fL   MCH 30.8 26.0 - 34.0 pg   MCHC 34.5 30.0 - 36.0 g/dL   RDW 12.4 11.5 - 15.5 %   Platelets 155 150 - 400 K/uL  Differential     Status: Abnormal   Collection Time: 01/20/14 11:50 AM  Result Value Ref Range   Neutrophils Relative % 83 (H) 43 - 77 %   Neutro Abs 12.2 (H) 1.7 - 7.7 K/uL   Lymphocytes Relative 8 (L) 12 - 46 %   Lymphs Abs 1.1 0.7 - 4.0 K/uL   Monocytes Relative 9 3 - 12 %   Monocytes Absolute 1.3 (H) 0.1 - 1.0 K/uL   Eosinophils Relative 0 0 - 5 %   Eosinophils Absolute 0.0 0.0 - 0.7 K/uL   Basophils Relative 0 0 - 1 %   Basophils Absolute 0.0 0.0 - 0.1 K/uL  Comprehensive metabolic panel     Status: Abnormal   Collection Time: 01/20/14 11:50 AM  Result Value Ref Range   Sodium 138 137 - 147 mEq/L   Potassium 4.6 3.7 - 5.3 mEq/L   Chloride 99 96 - 112 mEq/L   CO2 23 19 - 32 mEq/L   Glucose, Bld 109 (H) 70 - 99 mg/dL   BUN 39 (H) 6 - 23 mg/dL   Creatinine, Ser 3.26 (H) 0.50 - 1.10 mg/dL   Calcium 9.6 8.4 - 10.5 mg/dL   Total Protein 7.7 6.0 - 8.3 g/dL   Albumin 4.3 3.5 - 5.2 g/dL   AST 51 (H) 0 - 37 U/L   ALT 26 0 - 35 U/L   Alkaline Phosphatase 78 39 - 117 U/L   Total Bilirubin 0.9 0.3 - 1.2 mg/dL   GFR calc non Af Amer 12 (L) >90 mL/min   GFR calc Af Amer 14 (L) >90 mL/min    Comment: (NOTE) The eGFR has been calculated using the CKD EPI equation. This calculation  has not been validated in all clinical situations. eGFR's persistently <90 mL/min signify  possible Chronic Kidney Disease.    Anion gap 16 (H) 5 - 15  I-stat troponin, ED (not at [facility])     Status: None   Collection Time: 01/20/14 12:01 PM  Result Value Ref Range   Troponin i, poc 0.01 0.00 - 0.08 ng/mL   Comment 3            Comment: Due to the release kinetics of cTnI, a negative result within the first hours of the onset of symptoms does not rule out myocardial infarction with certainty. If myocardial infarction is still suspected, repeat the test at appropriate intervals.    Ct Head Wo Contrast  01/20/2014   CLINICAL DATA:  Left leg weakness, difficulty ambulating, dementia, confusion  EXAM: CT HEAD WITHOUT CONTRAST  TECHNIQUE: Contiguous axial images were obtained from the base of the skull through the vertex without intravenous contrast.  COMPARISON:  None.  FINDINGS: No evidence of parenchymal hemorrhage or extra-axial fluid collection. No mass lesion, mass effect, or midline shift.  No CT evidence of acute infarction.  Subcortical white matter and periventricular small vessel ischemic changes. Intracranial atherosclerosis.  Global cortical atrophy.  Secondary mild ventricular prominence.  The visualized paranasal sinuses are essentially clear. Partial opacification of the right mastoid air cells.  No evidence of calvarial fracture.  IMPRESSION: No evidence of acute intracranial abnormality.  Atrophy with small vessel ischemic changes and intracranial atherosclerosis.   Electronically Signed   By: Julian Hy M.D.   On: 01/20/2014 12:41    Assessment: 80 y.o. female with new onset monoparesis of the left leg. CT brain unremarkable for acute abnormality. Except for left LE weakness, her exam is unrevealing. No findings to suggest a cord syndrome or peripheral nerve involvement. I will entertain the possibility of a right ACA distribution infarct, although still unlikely. She is out  of the window for thrombolysis. Patient will need admission to medicine, get PT involved and complete further stroke work up if MRI positive for stroke. Aspirin. Will follow up.  Stroke Risk Factors - HTN, prior stroke  Plan: 1. HgbA1c, fasting lipid panel 2. MRI, MRA  of the brain without contrast 3. Echocardiogram 4. Carotid dopplers 5. Prophylactic therapy-aspirin 6. Risk factor modification 7. Telemetry monitoring 8. Frequent neuro checks 9. PT/OT SLP  Dorian Pod, MD [facility] [phone]  01/20/2014, 2:13 PM

## 2014-01-20 NOTE — Progress Notes (Signed)
Pt is admitted to room 4N05 from ED. Admission vital signs are stable

## 2014-01-21 ENCOUNTER — Inpatient Hospital Stay (HOSPITAL_COMMUNITY): Payer: Medicare Other

## 2014-01-21 DIAGNOSIS — N179 Acute kidney failure, unspecified: Principal | ICD-10-CM

## 2014-01-21 DIAGNOSIS — I10 Essential (primary) hypertension: Secondary | ICD-10-CM

## 2014-01-21 DIAGNOSIS — R29898 Other symptoms and signs involving the musculoskeletal system: Secondary | ICD-10-CM

## 2014-01-21 DIAGNOSIS — I6330 Cerebral infarction due to thrombosis of unspecified cerebral artery: Secondary | ICD-10-CM | POA: Insufficient documentation

## 2014-01-21 DIAGNOSIS — I369 Nonrheumatic tricuspid valve disorder, unspecified: Secondary | ICD-10-CM

## 2014-01-21 LAB — LIPID PANEL
Cholesterol: 119 mg/dL (ref 0–200)
HDL: 46 mg/dL (ref >39)
LDL Cholesterol: 59 mg/dL (ref 0–99)
Total CHOL/HDL Ratio: 2.6 RATIO
Triglycerides: 71 mg/dL (ref <150)
VLDL: 14 mg/dL (ref 0–40)

## 2014-01-21 LAB — BASIC METABOLIC PANEL
ANION GAP: 13 (ref 5–15)
BUN: 28 mg/dL — ABNORMAL HIGH (ref 6–23)
CALCIUM: 6.7 mg/dL — AB (ref 8.4–10.5)
CO2: 19 meq/L (ref 19–32)
Chloride: 112 mEq/L (ref 96–112)
Creatinine, Ser: 1.62 mg/dL — ABNORMAL HIGH (ref 0.50–1.10)
GFR, EST AFRICAN AMERICAN: 32 mL/min — AB (ref >90)
GFR, EST NON AFRICAN AMERICAN: 27 mL/min — AB (ref >90)
Glucose, Bld: 70 mg/dL (ref 70–99)
Potassium: 3 mEq/L — ABNORMAL LOW (ref 3.7–5.3)
SODIUM: 144 meq/L (ref 137–147)

## 2014-01-21 LAB — CK TOTAL AND CKMB (NOT AT ARMC)
CK, MB: 22.1 ng/mL — AB (ref 0.3–4.0)
Relative Index: 1.5 (ref 0.0–2.5)
Total CK: 1456 U/L — ABNORMAL HIGH (ref 7–177)

## 2014-01-21 LAB — HEMOGLOBIN A1C
HEMOGLOBIN A1C: 5.2 % (ref <5.7)
Mean Plasma Glucose: 103 mg/dL (ref <117)

## 2014-01-21 MED ORDER — ACETAMINOPHEN 325 MG PO TABS: 650.0000 mg | ORAL_TABLET | Freq: Four times a day (QID) | ORAL | Status: DC | PRN

## 2014-01-21 MED ORDER — POTASSIUM CHLORIDE CRYS ER 20 MEQ PO TBCR
40.0000 meq | EXTENDED_RELEASE_TABLET | Freq: Four times a day (QID) | ORAL | Status: AC
  Administered 2014-01-21 (×3): 40 meq via ORAL
  Filled 2014-01-21 (×3): qty 2

## 2014-01-21 MED ORDER — POTASSIUM CHLORIDE 10 MEQ/100ML IV SOLN
10.0000 meq | INTRAVENOUS | Status: DC
Start: 2014-01-21 — End: 2014-01-21
  Administered 2014-01-21: 10 meq via INTRAVENOUS
  Filled 2014-01-21: qty 100

## 2014-01-21 MED ORDER — OXYCODONE HCL 5 MG PO TABS
5.0000 mg | ORAL_TABLET | Freq: Four times a day (QID) | ORAL | Status: DC | PRN
  Administered 2014-01-21: 5 mg via ORAL
  Filled 2014-01-21: qty 1

## 2014-01-21 NOTE — Progress Notes (Signed)
VASCULAR LAB PRELIMINARY  PRELIMINARY  PRELIMINARY  PRELIMINARY  Carotid Dopplers completed.    Preliminary report:  1-39% ICA stenosis.  Vertebral artery flow is antegrade.   Estaban [place], RVT 01/21/2014, 11:45 AM

## 2014-01-21 NOTE — Progress Notes (Signed)
[facility] PROGRESS NOTE  Sylvia Villarreal JQB:[id number] DOB: [birth year] DOA: 01/20/2014 PCP: Donia Ast, FNP  Assessment/Plan: 1. Suspected CVA -Patient found by family members to have significant left lower extremity weakness. Initial CT scan was negative for acute intracranial abnormality. MRA was negative, awaiting MRI of brain at the time of this dictation. -Neurology following -Continue aspirin 325 mg by mouth daily. -Fasting lipid panel showed LDL of 59 -Physical therapy/occupational therapy consult  2.  Acute kidney injury -Patient presenting with a creatinine of 3.26 responding to fluid challenge as a.m. labs showed a downward trend to 1.62 -Likely secondary to prerenal azotemia/hypovolemia -Continue IV fluid resuscitation  3.  Rhabdomyolysis -Patient reported having a fall at home, with labs showing a CPK 1575 -Continue IV fluids  4. ?Status post fall -Patient with history of cognitive impairment, stating she had a fall at home that was unwitnessed -Will check bilateral hip x-ray as patient is complaining of pain  5.  Hypokalemia. -Labs showing potassium of 3.0 -Will replace with IV potassium  Code Status: Full code Family Communication:  Disposition Plan: Physical therapy/occupational therapy consult   Consultants:  Neurology   HPI/Subjective: Patient is a pleasant 80 year old female with a past medical history of cognitive impairment who was found by family members sitting on the couch unable to get up on her own. She was noted to have weakness involving the left lower extremity. Initial CT scan of brain showed no evidence of acute intracranial abnormality. Lab work was notable for acute kidney injury with creatinine of 3.26 and BUN of 39.  Objective: Filed Vitals:   01/21/14 0930  BP: 155/45  Pulse: 88  Temp: 99.7 F (37.6 C)  Resp: 16   No intake or output data in the 24 hours ending 01/21/14 0940 Filed Weights   01/20/14 1146   Weight: 47.628 kg (105 lb)    Exam:   General:  Patient is pleasant, cooperative, she was assisted out of bed to chair  Cardiovascular: Regular rate and rhythm normal S1-S2  Respiratory: Clear to auscultation bilaterally  Abdomen: Soft nontender nondistended  Musculoskeletal: No edema  Neurological. Patient having 35 muscle strength to left lower extremity, 450 strength to right lower extremity, no alteration to sensation. 5-5 muscle strength to bilateral upper extremities. She is confused, disoriented.   Data Reviewed: Basic Metabolic Panel:  Recent Labs Lab 01/20/14 1150 01/21/14 0800  NA 138 144  K 4.6 3.0*  CL 99 112  CO2 23 19  GLUCOSE 109* 70  BUN 39* 28*  CREATININE 3.26* 1.62*  CALCIUM 9.6 6.7*   Liver Function Tests:  Recent Labs Lab 01/20/14 1150  AST 51*  ALT 26  ALKPHOS 78  BILITOT 0.9  PROT 7.7  ALBUMIN 4.3   No results for input(s): LIPASE, AMYLASE in the last 168 hours. No results for input(s): AMMONIA in the last 168 hours. CBC:  Recent Labs Lab 01/20/14 1150  WBC 14.6*  NEUTROABS 12.2*  HGB 10.2*  HCT 29.6*  MCV 89.4  PLT 155   Cardiac Enzymes:  Recent Labs Lab 01/20/14 1540 01/21/14 0305  CKTOTAL 1575* 1456*  CKMB 22.7* 22.1*   BNP (last 3 results) No results for input(s): PROBNP in the last 8760 hours. CBG: No results for input(s): GLUCAP in the last 168 hours.  No results found for this or any previous visit (from the past 240 hour(s)).   Studies: Ct Head Wo Contrast  01/20/2014   CLINICAL DATA:  Left leg weakness, difficulty ambulating,  dementia, confusion  EXAM: CT HEAD WITHOUT CONTRAST  TECHNIQUE: Contiguous axial images were obtained from the base of the skull through the vertex without intravenous contrast.  COMPARISON:  None.  FINDINGS: No evidence of parenchymal hemorrhage or extra-axial fluid collection. No mass lesion, mass effect, or midline shift.  No CT evidence of acute infarction.  Subcortical white  matter and periventricular small vessel ischemic changes. Intracranial atherosclerosis.  Global cortical atrophy.  Secondary mild ventricular prominence.  The visualized paranasal sinuses are essentially clear. Partial opacification of the right mastoid air cells.  No evidence of calvarial fracture.  IMPRESSION: No evidence of acute intracranial abnormality.  Atrophy with small vessel ischemic changes and intracranial atherosclerosis.   Electronically Signed   By: Julian Hy M.D.   On: 01/20/2014 12:41   Mr Jodene Nam Head Wo Contrast  01/20/2014   CLINICAL DATA:  History of CVA.  Hypertension.  Dementia.  EXAM: MRA HEAD WITHOUT CONTRAST  TECHNIQUE: Angiographic images of the Circle of Willis were obtained using MRA technique without intravenous contrast.  COMPARISON:  CT head 01/20/2014  FINDINGS: Both vertebral arteries are patent to the basilar. The basilar is widely patent. Superior cerebellar and posterior cerebral arteries are patent bilaterally.  Internal carotid artery patent bilaterally. Anterior and middle cerebral arteries are patent without significant stenosis.  Negative for cerebral aneurysm.  IMPRESSION: Negative MRA head.   Electronically Signed   By: Franchot Gallo M.D.   On: 01/20/2014 18:06   US Renal  01/20/2014   CLINICAL DATA:  80 year old female with acute renal failure  EXAM: RENAL/URINARY TRACT ULTRASOUND COMPLETE  COMPARISON:  None.  FINDINGS: Right Kidney:  Length: 9.5 cm. Mildly echogenic renal parenchyma. Anechoic simple cyst in the upper pole measures 1.2 x 1.0 x 1.2 cm. There is a second small anechoic cyst at the lower pole which measures 1.0 x 0.8 x 0.9 cm. No focal solid lesion. No evidence of hydronephrosis.  Left Kidney:  Length: 8.5 cm. Mildly echogenic renal parenchyma. No hydronephrosis. 2.6 x 3.1 x 2.1 cm sonographically simple cyst in the interpolar kidney. Large anechoic simple cyst exophytic from the upper pole measures 5.2 x 3.9 x 4.7 cm.  Bladder:  Appears normal  for degree of bladder distention.  IMPRESSION: 1. No evidence of hydronephrosis. 2. Echogenic renal parenchymal bilaterally suggests underlying medical renal disease. 3. Kidneys are at the lower limits of normal for size. No focal renal cortical scarring. 4. Bilateral simple renal cysts.   Electronically Signed   By: Jacqulynn Cadet M.D.   On: 01/20/2014 21:55    Scheduled Meds: . aspirin  300 mg Rectal Daily   Or  . aspirin  325 mg Oral Daily  . heparin  5,000 Units Subcutaneous 3 times per day  . potassium chloride  10 mEq Intravenous Q1 Hr x 4   Continuous Infusions: . sodium chloride 100 mL/hr at 01/20/14 1841  . sodium chloride 1,000 mL (01/21/14 0450)    Principal Problem:   Left leg weakness Active Problems:   Essential hypertension   Stroke-like symptoms   Acute renal failure   Cerebral thrombosis with cerebral infarction    Time spent: 35 min    Kelvin Cellar  [facility] Pager [phone]. If 7PM-7AM, please contact night-coverage at [email or web address], password [facility] 01/21/2014, 9:40 AM  LOS: 1 day

## 2014-01-21 NOTE — Progress Notes (Signed)
  Echocardiogram 2D Echocardiogram has been performed.  Doyle Askew 01/21/2014, 9:43 AM

## 2014-01-21 NOTE — Progress Notes (Signed)
Follow-up Note Pelvic fractures in the elderly likely result from low energy mechanism, i.e. Falls. Imaging studies showing fracture involving left superior and inferior pubic ramus. In this case management with pain control and physical therapy would be appropriate.  Methylmethacrylate therapy would have no role in stable pelvic fractures.

## 2014-01-21 NOTE — Progress Notes (Signed)
History: Sylvia Villarreal is an 80 y.o. female with a past medical history significant for HTN, remote CVA without residual deficits, osteoporosis, breast cancer s/p mastectomy, cognitive impairment, brought in by family for further evaluation of left leg weakness. Never had similar symptom before. Patient is very active and walks two miles on a daily basis, but family stated that they couldn't get in touch with her on the phone so they went over and found her sitting on the couch and was unable to get up on her own. Patient said that yesterday morning when she was moving the trash can she noticed some difficulty with her left leg, had a fall in the afternoon, and the left leg has been week ever since. Has a HA that has been unchanged for quite some time, and denies vertigo, double vision, difficulty swallowing, slurred speech, language or vision impairment.  Some trouble controlling her bladder which is not new. Denies low back or leg pain/numbness. No neck pain. CT brain showed no acute intracranial abnormality.   Subjective: Multiple family members present. I discussed with the results of the MRI/MRA as well as the pelvic films. They reported that the patient has been having a lot of pain in her left hip since admission. Apparently the nurse gave the patient oxycodone recently and the patient is feeling much better. She is however somewhat confused.   Objective: Current vital signs: BP 155/45 mmHg  Pulse 88  Temp(Src) 99.7 F (37.6 C) (Oral)  Resp 16  Ht 5\' 1"  (1.549 m)  Wt 105 lb (47.628 kg)  BMI 19.85 kg/m2  SpO2 100% Vital signs in last 24 hours: Temp:  [98.3 F (36.8 C)-99.7 F (37.6 C)] 99.7 F (37.6 C) (11/29 0930) Pulse Rate:  [86-101] 88 (11/29 0930) Resp:  [14-22] 16 (11/29 0930) BP: (126-155)/(45-96) 155/45 mmHg (11/29 0930) SpO2:  [100 %] 100 % (11/29 0930)  Intake/Output from previous day:   Intake/Output this shift:   Nutritional status:    Physical  Exam  Neurologic Exam:  MENTAL STATUS: awake, alert, Language fluent Follows simple commands. Oriented to person and year. The patient thinks she is at home in her own bed. CRANIAL NERVES: No asymmetry upper or lower face. MOTOR: Strength 4-5 over 5 throughout except for the left lower extremity which was not tested secondary to pain. SENSORY: normal and symmetric to light touch  COORDINATION: finger-nose-finger normal  GAIT/STATION: Ambulation deferred secondary to fracture.   Lab Results: Basic Metabolic Panel:  Recent Labs Lab 01/20/14 1150 01/21/14 0800  NA 138 144  K 4.6 3.0*  CL 99 112  CO2 23 19  GLUCOSE 109* 70  BUN 39* 28*  CREATININE 3.26* 1.62*  CALCIUM 9.6 6.7*    Liver Function Tests:  Recent Labs Lab 01/20/14 1150  AST 51*  ALT 26  ALKPHOS 78  BILITOT 0.9  PROT 7.7  ALBUMIN 4.3   No results for input(s): LIPASE, AMYLASE in the last 168 hours. No results for input(s): AMMONIA in the last 168 hours.  CBC:  Recent Labs Lab 01/20/14 1150  WBC 14.6*  NEUTROABS 12.2*  HGB 10.2*  HCT 29.6*  MCV 89.4  PLT 155    Cardiac Enzymes:  Recent Labs Lab 01/20/14 1540 01/21/14 0305  CKTOTAL 1575* 1456*  CKMB 22.7* 22.1*    Lipid Panel:  Recent Labs Lab 01/21/14 0305  CHOL 119  TRIG 71  HDL 46  CHOLHDL 2.6  VLDL 14  LDLCALC 59    CBG: No results  for input(s): GLUCAP in the last 168 hours.  Microbiology: No results found for this or any previous visit.  Coagulation Studies:  Recent Labs  01/20/14 1150  LABPROT 15.2  INR 1.18    Imaging:   2-D echocardiogram - Left ventricle: Upper septal thickening. No LVOT gradient. The cavity size was normal. Wall thickness was increased in a pattern of mild LVH. The estimated ejection fraction was 65%. Wall motion was normal; there were no regional wall motion abnormalities. - Right ventricle: The cavity size was normal. Systolic function was normal. - Impressions: No  cardiac source of embolism was identified, but cannot be ruled out on the basis of this examination. Impressions: - No cardiac source of embolism was identified, but cannot be ruled out on the basis of this examination.  Carotid Dopplers - Preliminary report: 1-39% ICA stenosis. Vertebral artery flow is antegrade.    Dg Hip Bilateral W/pelvis 01/21/2014    There is mild displaced fracture of the left superior and inferior pubic ramus. No hip fracture or subluxation.   Electronically Signed   By: Lahoma Crocker M.D.   On: 01/21/2014 11:52    Ct Head Wo Contrast 01/20/2014    No evidence of acute intracranial abnormality.  Atrophy with small vessel ischemic changes and intracranial atherosclerosis.    Mr Jodene Nam Head Wo Contrast 01/20/2014    Negative MRA head.     Mr Brain Wo Contrast 01/21/2014    Atrophy and chronic microvascular ischemia.  No acute abnormality.      US Renal 01/20/2014    1. No evidence of hydronephrosis.  2. Echogenic renal parenchymal bilaterally suggests underlying medical renal disease.  3. Kidneys are at the lower limits of normal for size. No focal renal cortical scarring.  4. Bilateral simple renal cysts.       Medications:  Scheduled: . aspirin  300 mg Rectal Daily   Or  . aspirin  325 mg Oral Daily  . heparin  5,000 Units Subcutaneous 3 times per day  . potassium chloride  40 mEq Oral Q6H    Assessment/Plan: Patient admitted for evaluation of left lower extremity weakness - rule out stroke. MRI / MRA negative for acute findings. Dg Hip Bilateral W/pelvis - mild displaced fracture of the left superior and inferior pubic ramus. Most likely etiology of patient's symptoms.   Consider consulting Dr. Estanislado Pandy in interventional radiology at [facility] as this fracture may be amenable to methylmethacrylate therapy for stabilization and pain relief.  Neurology will sign off at this time. Please call if we can be of further service.  Mikey Bussing PA-C [facility] Pager [phone] 01/21/2014, 4:33 PM  Patient seen and examined together with physician assistant and I concur with the assessment and plan.  Dorian Pod, MD

## 2014-01-21 NOTE — Progress Notes (Signed)
PT Cancellation Note  Patient Details Name: Sylvia Villarreal MRN: [id number] DOB: Sep 15, 1924   Cancelled Treatment:    Reason Eval/Treat Not Completed: Patient at procedure or test/unavailable.  Attempted to see patient x2 - this am in radiology for x-ray, now leaving for MRI.  Will return later pm or in am for PT evaluation.  *Per radiology report, patient with "displaced fractures of Lt superior and inferior pubic rami"  Per Dr. Coralyn Pear, patient can be WBAT.   Despina Pole 01/21/2014, 1:51 PM Carita Pian. Sanjuana Kava, [place] Pager [phone]

## 2014-01-22 DIAGNOSIS — S32599A Other specified fracture of unspecified pubis, initial encounter for closed fracture: Secondary | ICD-10-CM

## 2014-01-22 DIAGNOSIS — S32502A Unspecified fracture of left pubis, initial encounter for closed fracture: Secondary | ICD-10-CM

## 2014-01-22 LAB — BASIC METABOLIC PANEL
ANION GAP: 9 (ref 5–15)
BUN: 21 mg/dL (ref 6–23)
CALCIUM: 8.6 mg/dL (ref 8.4–10.5)
CO2: 23 meq/L (ref 19–32)
Chloride: 107 mEq/L (ref 96–112)
Creatinine, Ser: 1.14 mg/dL — ABNORMAL HIGH (ref 0.50–1.10)
GFR calc Af Amer: 49 mL/min — ABNORMAL LOW (ref >90)
GFR, EST NON AFRICAN AMERICAN: 42 mL/min — AB (ref >90)
Glucose, Bld: 89 mg/dL (ref 70–99)
Potassium: 5.2 mEq/L (ref 3.7–5.3)
SODIUM: 139 meq/L (ref 137–147)

## 2014-01-22 NOTE — Progress Notes (Signed)
Patient ID: Sylvia Villarreal, female   DOB: 25-Dec-1924, 80 y.o.   MRN: [id number] I have reviewed Sylvia Villarreal' pelvic x-rays.  She does have mildly displaced left superior/inferior pubic rami fractures.  These types of fractures usually do well with time and should heal.  The usual recommendation is full weight bearing as tolerated.  These are non-load bearing bones. They will still be painful with weight bearing or attempted weight bearing.  If she is not tolerating full weight bearing, then decrease her weight bearing status to what she can tolerate and increase her to full weight bearing as tolerated.
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
## 2014-01-22 NOTE — Progress Notes (Signed)
CARE MANAGEMENT NOTE 01/22/2014  Patient:  Sylvia Villarreal, Sylvia Villarreal   Account Number:  [id number]  Date Initiated:  01/22/2014  Documentation initiated by:  [facility]  Subjective/Objective Assessment:   mildly displaced left superior/inferior pubic rami fractures     Action/Plan:   lives alone, SNF recommended.   Anticipated DC Date:  01/23/2014   Anticipated DC Plan:  SKILLED NURSING FACILITY  In-house referral  Clinical Social Worker      DC Planning Services  CM consult      Choice offered to / List presented to:             Status of service:  Completed, signed off Medicare Important Message given?  YES (If response is "NO", the following Medicare IM given date fields will be blank) Date Medicare IM given:  01/22/2014 Medicare IM given by:  [facility] Date Additional Medicare IM given:   Additional Medicare IM given by:    Discharge Disposition:  [place]  Per UR Regulation:    If discussed at Long Length of Stay Meetings, dates discussed:    Comments:  01/22/2014 1030 NCM spoke to pt and she is agreeable to SNF -rehab. Gave permission to speak to dtr, Jenell Milliner [phone]. Dtr agrees with SNF. CSW following for placement. Jonnie Finner RN CCM Case Mgmt phone [phone]

## 2014-01-22 NOTE — Progress Notes (Signed)
SLP Cancellation Note  Patient Details Name: Sylvia Villarreal MRN: [id number] DOB: 01-17-1925   Cancelled treatment:       Reason Eval/Treat Not Completed: SLP screened, no needs identified, will sign off. MRI negative for acute infarct. Pt and daughter present both report that she is at her cognitive-linguistic baseline. No SLP intervention recommended at this time, please re-order as needed.    [name], M.A. CCC-SLP [phone]  [name] 01/22/2014, 3:53 PM

## 2014-01-22 NOTE — Progress Notes (Signed)
[facility] PROGRESS NOTE  Sylvia Villarreal ZJQ:[id number] DOB: 1925/01/24 DOA: 01/20/2014 PCP: Donia Ast, FNP  Assessment/Plan: 1. Pelvic fracture -Patient having a fall at home, having left lower extremity weakness on exam. Initially admitted for stroke workup, plain films showing mild displaced fracture of the left superior and inferior pubic ramus, without hip fracture or subluxation. -Case discussed with orthopedic surgery, agreed with pain management and physical therapy -Weightbearing as tolerated -Social work consulted for skilled nursing facility placement   2.  Acute kidney injury -Patient presenting with a creatinine of 3.26 responding to fluid challenge  -A.m. labs showing resolution of acute kidney injury, creatinine 1.14 with BUN of 21  5.  Hypokalemia. -Resolved -Labs showing potassium of 3.0 -After receiving potassium replacement, a.m. labs showing potassium of 5.2  Code Status: Full code Family Communication: Spoke with her daughter present at bedside Disposition Plan: Physical therapy/occupational therapy consult   Consultants:  Neurology  HPI/Subjective: Patient is a pleasant 80 year old female with a past medical history of cognitive impairment who was found by family members sitting on the couch unable to get up on her own. She was noted to have weakness involving the left lower extremity. Initial CT scan of brain showed no evidence of acute intracranial abnormality. Lab work was notable for acute kidney injury with creatinine of 3.26 and BUN of 39.  Objective: Filed Vitals:   01/22/14 1500  BP: 161/63  Pulse: 75  Temp: 98.6 F (37 C)  Resp: 16   No intake or output data in the 24 hours ending 01/22/14 1551 Filed Weights   01/20/14 1146  Weight: 47.628 kg (105 lb)    Exam:   General:  Patient is pleasant, cooperative, she was assisted out of bed to chair  Cardiovascular: Regular rate and rhythm normal S1-S2  Respiratory: Clear  to auscultation bilaterally  Abdomen: Soft nontender nondistended  Musculoskeletal: No edema  Neurological. Patient's neurologic exam limited by pain, worse over left hip region. She continues to have pain with weightbearing  Data Reviewed: Basic Metabolic Panel:  Recent Labs Lab 01/20/14 1150 01/21/14 0800 01/22/14 0415  NA 138 144 139  K 4.6 3.0* 5.2  CL 99 112 107  CO2 23 19 23   GLUCOSE 109* 70 89  BUN 39* 28* 21  CREATININE 3.26* 1.62* 1.14*  CALCIUM 9.6 6.7* 8.6   Liver Function Tests:  Recent Labs Lab 01/20/14 1150  AST 51*  ALT 26  ALKPHOS 78  BILITOT 0.9  PROT 7.7  ALBUMIN 4.3   No results for input(s): LIPASE, AMYLASE in the last 168 hours. No results for input(s): AMMONIA in the last 168 hours. CBC:  Recent Labs Lab 01/20/14 1150  WBC 14.6*  NEUTROABS 12.2*  HGB 10.2*  HCT 29.6*  MCV 89.4  PLT 155   Cardiac Enzymes:  Recent Labs Lab 01/20/14 1540 01/21/14 0305  CKTOTAL 1575* 1456*  CKMB 22.7* 22.1*   BNP (last 3 results) No results for input(s): PROBNP in the last 8760 hours. CBG: No results for input(s): GLUCAP in the last 168 hours.  No results found for this or any previous visit (from the past 240 hour(s)).   Studies: Dg Hip Bilateral W/pelvis  01/21/2014   CLINICAL DATA:  Bilateral hip pain post fall at home 01/19/2014, left lateral hip pain  EXAM: BILATERAL HIP WITH PELVIS - 4+ VIEW  COMPARISON:  None.  FINDINGS: Five views bilateral hip submitted. Mild displaced fracture of the left superior and inferior pubic ramus. Bilateral femoral  head is located in acetabular joint. No hip fracture or subluxation.  IMPRESSION: There is mild displaced fracture of the left superior and inferior pubic ramus. No hip fracture or subluxation.   Electronically Signed   By: Lahoma Crocker M.D.   On: 01/21/2014 11:52   Mr Jodene Nam Head Wo Contrast  01/20/2014   CLINICAL DATA:  History of CVA.  Hypertension.  Dementia.  EXAM: MRA HEAD WITHOUT CONTRAST   TECHNIQUE: Angiographic images of the Circle of Willis were obtained using MRA technique without intravenous contrast.  COMPARISON:  CT head 01/20/2014  FINDINGS: Both vertebral arteries are patent to the basilar. The basilar is widely patent. Superior cerebellar and posterior cerebral arteries are patent bilaterally.  Internal carotid artery patent bilaterally. Anterior and middle cerebral arteries are patent without significant stenosis.  Negative for cerebral aneurysm.  IMPRESSION: Negative MRA head.   Electronically Signed   By: Franchot Gallo M.D.   On: 01/20/2014 18:06   Mr Brain Wo Contrast  01/21/2014   CLINICAL DATA:  Stroke.  Left leg weakness  EXAM: MRI HEAD WITHOUT CONTRAST  TECHNIQUE: Multiplanar, multiecho pulse sequences of the brain and surrounding structures were obtained without intravenous contrast.  COMPARISON:  MRA head 01/20/2014.  CT head 01/20/2014  FINDINGS: Moderate atrophy.  Negative for acute infarct  Chronic microvascular ischemic changes in the white matter of a moderate degree. Mild chronic ischemia in the pons.  Chronic micro hemorrhage in the right medial occipital lobe. No other hemorrhage or fluid collection  Negative for mass or edema.  IMPRESSION: Atrophy and chronic microvascular ischemia.  No acute abnormality.   Electronically Signed   By: Franchot Gallo M.D.   On: 01/21/2014 15:13   US Renal  01/20/2014   CLINICAL DATA:  80 year old female with acute renal failure  EXAM: RENAL/URINARY TRACT ULTRASOUND COMPLETE  COMPARISON:  None.  FINDINGS: Right Kidney:  Length: 9.5 cm. Mildly echogenic renal parenchyma. Anechoic simple cyst in the upper pole measures 1.2 x 1.0 x 1.2 cm. There is a second small anechoic cyst at the lower pole which measures 1.0 x 0.8 x 0.9 cm. No focal solid lesion. No evidence of hydronephrosis.  Left Kidney:  Length: 8.5 cm. Mildly echogenic renal parenchyma. No hydronephrosis. 2.6 x 3.1 x 2.1 cm sonographically simple cyst in the interpolar  kidney. Large anechoic simple cyst exophytic from the upper pole measures 5.2 x 3.9 x 4.7 cm.  Bladder:  Appears normal for degree of bladder distention.  IMPRESSION: 1. No evidence of hydronephrosis. 2. Echogenic renal parenchymal bilaterally suggests underlying medical renal disease. 3. Kidneys are at the lower limits of normal for size. No focal renal cortical scarring. 4. Bilateral simple renal cysts.   Electronically Signed   By: Jacqulynn Cadet M.D.   On: 01/20/2014 21:55    Scheduled Meds: . aspirin  300 mg Rectal Daily   Or  . aspirin  325 mg Oral Daily  . heparin  5,000 Units Subcutaneous 3 times per day   Continuous Infusions:    Principal Problem:   Left leg weakness Active Problems:   Essential hypertension   Stroke-like symptoms   Acute renal failure   Cerebral thrombosis with cerebral infarction   Closed fracture of multiple pubic rami    Time spent: 25 min    Kelvin Cellar  [facility] Pager [phone]. If 7PM-7AM, please contact night-coverage at [email or web address], password [facility] 01/22/2014, 3:51 PM  LOS: 2 days

## 2014-01-22 NOTE — Clinical Social Work Note (Signed)
Clinical Social Worker has assessed patient. Patient and pt's family agreeable to SNF placement. Full psychosocial to follow.   CSW will continue to follow pt for continued support and to facilitate pt's discharge needs once medically stable.   Glendon Axe, MSW, LCSWA [phone] 01/22/2014 2:52 PM

## 2014-01-22 NOTE — Clinical Social Work Placement (Addendum)
    Clinical Social Work Department CLINICAL SOCIAL WORK PLACEMENT NOTE 01/22/2014  Patient:  Sylvia Villarreal, Sylvia Villarreal  Account Number:  [id number] Admit date:  01/20/2014  Clinical Social Worker:  Glendon Axe, CLINICAL SOCIAL WORKER  Date/time:  01/22/2014 03:35 PM  Clinical Social Work is seeking post-discharge placement for this patient at the following level of care:   SKILLED NURSING   (*CSW will update this form in Epic as items are completed)   01/22/2014  Patient/family provided with [employer] Department of Clinical Social Work's list of facilities offering this level of care within the geographic area requested by the patient (or if unable, by the patient's family).  01/22/2014  Patient/family informed of their freedom to choose among providers that offer the needed level of care, that participate in Medicare, Medicaid or managed care program needed by the patient, have an available bed and are willing to accept the patient.  01/22/2014  Patient/family informed of [facility]' ownership interest in [facility], as well as of the fact that they are under no obligation to receive care at this facility.  PASARR submitted to EDS on 01/22/2014 PASARR number received on 01/22/2014  FL2 transmitted to all facilities in geographic area requested by pt/family on  01/23/2104 FL2 transmitted to all facilities within larger geographic area on   Patient informed that his/her managed care company has contracts with or will negotiate with  certain facilities, including the following:  YES   Patient/family informed of bed offers received:  01/23/2014 Patient chooses bed at [facility] and [place] recommends and patient chooses bed at    Patient to be transferred to [facility] and Rehab  On 01/23/2014   Patient to be transferred to facility by [place] Patient and family notified of transfer on 01/23/2014 Name of family member notified:  Pt's daughter,  Hassan Rowan  The following physician request were entered in Epic:   Additional Comments:   Glendon Axe, MSW, [place] [phone] 01/22/2014 3:37 PM

## 2014-01-22 NOTE — Discharge Instructions (Signed)
Back Pain, Adult Back pain is very common. The pain often gets better over time. The cause of back pain is usually not dangerous. Most people can learn to manage their back pain on their own.  HOME CARE   Stay active. Start with short walks on flat ground if you can. Try to walk farther each day.  Do not sit, drive, or stand in one place for more than 30 minutes. Do not stay in bed.  Do not avoid exercise or work. Activity can help your back heal faster.  Be careful when you bend or lift an object. Bend at your knees, keep the object close to you, and do not twist.  Sleep on a firm mattress. Lie on your side, and bend your knees. If you lie on your back, put a pillow under your knees.  Only take medicines as told by your doctor.  Put ice on the injured area.  Put ice in a plastic bag.  Place a towel between your skin and the bag.  Leave the ice on for 15-20 minutes, 03-04 times a day for the first 2 to 3 days. After that, you can switch between ice and heat packs.  Ask your doctor about back exercises or massage.  Avoid feeling anxious or stressed. Find good ways to deal with stress, such as exercise. GET HELP RIGHT AWAY IF:   Your pain does not go away with rest or medicine.  Your pain does not go away in 1 week.  You have new problems.  You do not feel well.  The pain spreads into your legs.  You cannot control when you poop (bowel movement) or pee (urinate).  Your arms or legs feel weak or lose feeling (numbness).  You feel sick to your stomach (nauseous) or throw up (vomit).  You have belly (abdominal) pain.  You feel like you may pass out (faint). MAKE SURE YOU:   Understand these instructions.  Will watch your condition.  Will get help right away if you are not doing well or get worse. Document Released: 07/29/2007 Document Revised: 05/04/2011 Document Reviewed: 06/13/2013 [facility] Patient Information 2015 [place], [place]. This information is not intended  to replace advice given to you by your health care provider. Make sure you discuss any questions you have with your health care provider.   Full weight bearing as tolerated left hip/pelvis

## 2014-01-22 NOTE — Evaluation (Signed)
Physical Therapy Evaluation Patient Details Name: Sylvia Villarreal MRN: [id number] DOB: 1924/05/21 Today's Date: 01/22/2014   History of Present Illness  Adm 01/20/14 s/p fall with Lt superior and inferior pubic rami fractures. PMHx-dementia, osteoporosis, remote CVA, breast cancer  Clinical Impression  Patient is s/p fall with pelvic fractures resulting in functional limitations due to the deficits listed below (see PT Problem List). Per chart, pt lived alone PTA. Per pt, her daughter plans for her to go to SNF for therapies. Patient will benefit from skilled PT to increase their independence and safety with mobility to allow discharge to the venue listed below.       Follow Up Recommendations SNF;Supervision/Assistance - 24 hour    Equipment Recommendations  Rolling walker with 5" wheels    Recommendations for Other Services       Precautions / Restrictions Precautions Precautions: Fall Restrictions Weight Bearing Restrictions: Yes LLE Weight Bearing: Weight bearing as tolerated Other Position/Activity Restrictions: Dr. Coralyn Pear confirmed WBAT with Dr. Rush Farmer, orthopedics      Mobility  Bed Mobility               General bed mobility comments: recently up to chair, therefore not tested  Transfers Overall transfer level: Needs assistance Equipment used: Rolling walker (2 wheeled) Transfers: Sit to/from Stand Sit to Stand: Mod assist         General transfer comment: step by step verbal cues after visual demonstration; pt achieved upright standing, however could not advance LLE with UEs "collapsing" when trying to push thru RW   Ambulation/Gait             General Gait Details: unable  Stairs            Wheelchair Mobility    Modified Rankin (Stroke Patients Only)       Balance Overall balance assessment: Needs assistance         Standing balance support: Single extremity supported Standing balance-Leahy Scale: Poor Standing balance  comment: stood with RW x 60 seconds; able to lift RUE overhead and maintain balance, could not lift LUE due to pain LLE                             Pertinent Vitals/Pain Pain Assessment: Faces Faces Pain Scale: Hurts whole lot Pain Location: Lt pelvis/groin Pain Descriptors / Indicators: Other (Comment) (pulling) Pain Intervention(s): Limited activity within patient's tolerance;Monitored during session;Repositioned    Home Living Family/patient expects to be discharged to:: Skilled nursing facility Living Arrangements: Alone (per H&P; family checks in twice per day)               Additional Comments: per pt (?reliability) her daughter wants her to go to SNF    Prior Function Level of Independence: Independent         Comments: walked 2 mi per day     Hand Dominance        Extremity/Trunk Assessment   Upper Extremity Assessment: Generalized weakness (poor ability to support herself on RW)           Lower Extremity Assessment: LLE deficits/detail   LLE Deficits / Details: AROM knee and ankle WFL; hip AAROM to 100 degrees flexion (seated); hip flexion 1+ (limited by pain), DF 5/5  Cervical / Trunk Assessment: Normal  Communication   Communication: No difficulties  Cognition Arousal/Alertness: Awake/alert Behavior During Therapy: WFL for tasks assessed/performed Overall Cognitive Status: No family/caregiver present to determine  baseline cognitive functioning                      General Comments General comments (skin integrity, edema, etc.): Spoke with Dr. Coralyn Pear re: concern for weightbearing status from orthopedics. He contacted ortho and they confirmed WBAT. Dr. Coralyn Pear reported he assisted pt bed to chair with +2 assist and pt did not put any weight through LLE    Exercises General Exercises - Lower Extremity Ankle Circles/Pumps: AROM;Both;10 reps;Seated Long Arc Quad: AROM;Both;10 reps;Seated Hip Flexion/Marching: AAROM;Left;Seated       Assessment/Plan    PT Assessment Patient needs continued PT services  PT Diagnosis Difficulty walking;Acute pain   PT Problem List Decreased strength;Decreased range of motion;Decreased activity tolerance;Decreased balance;Decreased mobility;Decreased cognition;Decreased knowledge of use of DME;Pain  PT Treatment Interventions DME instruction;Gait training;Functional mobility training;Therapeutic activities;Therapeutic exercise;Cognitive remediation;Patient/family education   PT Goals (Current goals can be found in the Care Plan section) Acute Rehab PT Goals Patient Stated Goal: be able to return home alone after she has healed PT Goal Formulation: With patient Time For Goal Achievement: 02/05/14 Potential to Achieve Goals: Good    Frequency Min 3X/week   Barriers to discharge Decreased caregiver support      Co-evaluation               End of Session Equipment Utilized During Treatment: Gait belt Activity Tolerance: Patient limited by pain Patient left: in chair;with chair alarm set;with call bell/phone within reach Nurse Communication: Mobility status (inability to use RW for transfer)         Time: 1004-1025 PT Time Calculation (min) (ACUTE ONLY): 21 min   Charges:   PT Evaluation $Initial PT Evaluation Tier I: 1 Procedure PT Treatments $Therapeutic Exercise: 8-22 mins   PT G Codes:          Guillaume Weninger 02-17-2014, 10:44 AM  Pager [phone]

## 2014-01-22 NOTE — Consult Note (Signed)
  I have seen Sylvia Villarreal and spoken to her in length about her left-sided pelvic fractures.  I showed her the x-rays as well.  She does tolerate left hip motion on exam as well as compression of her left hip joint.  She is painful to palpation over her pubis to the left.  From an ortho standpoint, she can attempt full weight bearing as tolerated, but only up with a walker and assistance with therapy.

## 2014-01-22 NOTE — Clinical Social Work Psychosocial (Addendum)
    Clinical Social Work Department BRIEF PSYCHOSOCIAL ASSESSMENT 01/22/2014  Patient:  Sylvia Villarreal, Sylvia Villarreal     Account Number:  [id number]     Admit date:  01/20/2014  Clinical Social Worker:  Glendon Axe, CLINICAL SOCIAL WORKER  Date/Time:  01/22/2014 03:24 PM  Referred by:  Physician  Date Referred:  01/22/2014 Referred for  SNF Placement   Other Referral:   Interview type:  Other - See comment Other interview type:   CSW spoke with pt's daughter, Hassan Rowan via telephone.    PSYCHOSOCIAL DATA Living Status:  ALONE Admitted from facility:   Level of care:   Primary support name:  Jenell Milliner Primary support relationship to patient:  CHILD, ADULT Degree of support available:   Strong    CURRENT CONCERNS Current Concerns  Post-Acute Placement   Other Concerns:    SOCIAL WORK ASSESSMENT / PLAN Clinical Social Worker spoke with pt's daughter in reference to post-acute placement for SNF. CSW explained SNF process and provided SNF list. Pt's daughter reported being aware of recommendation from MD and agreeable to placement. Pt's daughter stated she would like to review SNF list with CSW during visitation. CSW awaiting a phone call from pt's daughter once she arrives for visitation. CSW will continue to follow pt and pt's family for continued support and to facilitate pt's discharge needs once medically stable.   Assessment/plan status:  Psychosocial Support/Ongoing Assessment of Needs Other assessment/ plan:   Information/referral to community resources:   SNF information provided.    PATIENT'S/FAMILY'S RESPONSE TO PLAN OF CARE: Pt lying in bed alert and oriented X4. Pt's daughter was kind and  appreciated social work intervention. Pt and pt's daughter agreeable to SNF placement.     Glendon Axe, MSW, LCSWA [phone] 01/22/2014 3:34 PM

## 2014-01-22 NOTE — Clinical Social Work Note (Signed)
Clinical Social Worker met with patient and pt's daughters at length in reference to post-acute placement for SNF. CSW explained SNF process and reviewed SNF list again. Pt's daughters reported they would visit preferred SNF's tomorrow morning, make a decision, and report back to CSW.  Patient and pt's daughters both agreeable to SNF placement, prepared for discharge and prefers placement at [facility] and [facility].   CSW will continued to provide support and facilitate pt's discharge needs once medically stable.  FL-2 on chart for MD signature.  Glendon Axe, MSW, LCSWA [phone] 01/22/2014 5:47 PM

## 2014-01-23 DIAGNOSIS — R29898 Other symptoms and signs involving the musculoskeletal system: Secondary | ICD-10-CM | POA: Diagnosis not present

## 2014-01-23 DIAGNOSIS — R531 Weakness: Secondary | ICD-10-CM | POA: Diagnosis not present

## 2014-01-23 DIAGNOSIS — S32501A Unspecified fracture of right pubis, initial encounter for closed fracture: Secondary | ICD-10-CM

## 2014-01-23 DIAGNOSIS — M79605 Pain in left leg: Secondary | ICD-10-CM | POA: Diagnosis not present

## 2014-01-23 DIAGNOSIS — S32512A Fracture of superior rim of left pubis, initial encounter for closed fracture: Secondary | ICD-10-CM | POA: Diagnosis not present

## 2014-01-23 DIAGNOSIS — R278 Other lack of coordination: Secondary | ICD-10-CM | POA: Diagnosis not present

## 2014-01-23 DIAGNOSIS — R4189 Other symptoms and signs involving cognitive functions and awareness: Secondary | ICD-10-CM | POA: Diagnosis not present

## 2014-01-23 DIAGNOSIS — Z853 Personal history of malignant neoplasm of breast: Secondary | ICD-10-CM | POA: Diagnosis not present

## 2014-01-23 DIAGNOSIS — K5900 Constipation, unspecified: Secondary | ICD-10-CM | POA: Diagnosis not present

## 2014-01-23 DIAGNOSIS — R2681 Unsteadiness on feet: Secondary | ICD-10-CM | POA: Diagnosis not present

## 2014-01-23 DIAGNOSIS — M6281 Muscle weakness (generalized): Secondary | ICD-10-CM | POA: Diagnosis not present

## 2014-01-23 DIAGNOSIS — I10 Essential (primary) hypertension: Secondary | ICD-10-CM | POA: Diagnosis not present

## 2014-01-23 DIAGNOSIS — R1311 Dysphagia, oral phase: Secondary | ICD-10-CM | POA: Diagnosis not present

## 2014-01-23 DIAGNOSIS — N179 Acute kidney failure, unspecified: Secondary | ICD-10-CM | POA: Diagnosis not present

## 2014-01-23 DIAGNOSIS — R41841 Cognitive communication deficit: Secondary | ICD-10-CM | POA: Diagnosis not present

## 2014-01-23 DIAGNOSIS — S32509D Unspecified fracture of unspecified pubis, subsequent encounter for fracture with routine healing: Secondary | ICD-10-CM | POA: Diagnosis not present

## 2014-01-23 DIAGNOSIS — F0390 Unspecified dementia without behavioral disturbance: Secondary | ICD-10-CM | POA: Diagnosis not present

## 2014-01-23 DIAGNOSIS — M810 Age-related osteoporosis without current pathological fracture: Secondary | ICD-10-CM | POA: Diagnosis not present

## 2014-01-23 MED ORDER — ACETAMINOPHEN 325 MG PO TABS: 650.0000 mg | ORAL_TABLET | Freq: Four times a day (QID) | ORAL | Status: DC | PRN

## 2014-01-23 MED ORDER — ASPIRIN 325 MG PO TABS: 325.0000 mg | ORAL_TABLET | Freq: Every day | ORAL | Status: DC

## 2014-01-23 MED ORDER — OXYCODONE HCL 5 MG PO TABS: 5.0000 mg | ORAL_TABLET | Freq: Four times a day (QID) | ORAL | Status: DC | PRN

## 2014-01-23 MED ORDER — FLEET ENEMA 7-19 GM/118ML RE ENEM
1.0000 | ENEMA | Freq: Once | RECTAL | Status: AC
  Administered 2014-01-23: 1 via RECTAL
  Filled 2014-01-23: qty 1

## 2014-01-23 MED ORDER — SENNOSIDES-DOCUSATE SODIUM 8.6-50 MG PO TABS: 1.0000 | ORAL_TABLET | Freq: Every evening | ORAL | Status: DC | PRN

## 2014-01-23 NOTE — Progress Notes (Signed)
Report given to RN at [facility]. Pt to be transported per ambulance by ambulance personnel to facility.  Pt to be given enema and work with physical therapy prior to dc. Will monitor.  Sylvia Villarreal I 01/23/2014 1:19 PM

## 2014-01-23 NOTE — Progress Notes (Signed)
Pt transported out of unit perstretcher by ambulance personnel for transport to nursing facility. No acute distress noted.  belonigns with pt.   Angeline Slim I 01/23/2014 6:31 PM

## 2014-01-23 NOTE — Discharge Summary (Addendum)
Physician Discharge Summary  Sylvia Villarreal EUM:[id number] DOB: 25-May-1924 DOA: 01/20/2014  PCP: Donia Ast, FNP  Admit date: 01/20/2014 Discharge date: 01/23/2014  Time spent: 35 minutes  Recommendations for Outpatient Follow-up:  1. Please follow up on blood pressures as she had elevated pressures  2. Weight bearing as tolerated  Discharge Diagnoses:  Principal Problem:   Left leg weakness Active Problems:   Essential hypertension   Stroke-like symptoms   Acute renal failure   Cerebral thrombosis with cerebral infarction   Closed fracture of multiple pubic rami   Discharge Condition: Stable  Diet recommendation: Heart Healthy  Filed Weights   01/20/14 1146  Weight: 47.628 kg (105 lb)    History of present illness:  Sylvia Villarreal is a 80 y.o. female, with significant past medical history of hypertension, remote CVA in the past without residual deficits, osteoporosis, cognitive impairment/dementia, breast cancer status post mastectomy, patient lives home by herself, with close family supervision and visits. Patient is fairly active, with active lifestyle, walks 2 miles per day as per family, family couldn't get in touch with her this morning, so they want to see her at home, they found her sitting on the couch, unable to get up on his own, she was limping, complaining of left lower extremity weakness, patient has dementia, confused, her history is not very reliable, she reports he was taking her trash yesterday, where she had some weakness, she denies any atelectasis, no left upper extremity weakness, no visual problem, no facial droop, no slurred speech. As well patient was noticed to be in acute renal failure, baseline creatinine is 1.4, today's appear to be 3.4, patient reports she is taking Aleve at home, and she is on ACE inhibitor as well.  Hospital Course:  Patient is a pleasant 80 year old female with a past medical history of cognitive impairment who was found  by family members sitting on the couch unable to get up on her own. She was noted to have weakness involving the left lower extremity. Initial CT scan of brain showed no evidence of acute intracranial abnormality. Lab work was notable for acute kidney injury with creatinine of 3.26 and BUN of 39. There was concerns for the possibility of acute CVA or which neurology was consulted and she was admitted to telemetry placed on CVA protocol. Patient had reported having a fall at home which was unwitnessed. She has a history of cognitive impairment. On exam she had hematoma over left hip and reported pain with weightbearing. X-ray of bilateral hip revealed presence of mild displaced fracture of the left superior and inferior pubic ramus. There was no hip fracture or subluxation. MRI of the brain was negative for acute CVA. I suspect the etiology of her left lower extremity weakness was hip fracture/pain rather than an ischemic process. Case was discussed with Dr. Ninfa Linden of orthopedic surgery agreed with management with pain control and physical therapy. Social work was consulted for placing her at skilled nursing facility where she can undergo rehabilitation. With regard to kidney function or creatinine normalized with the administration of IV fluids, suggesting prerenal azotemia etiology. Patient was discharged to skilled nursing facility in stable condition on 01/23/2014.  Procedures:  Transthoracic echocardiogram performed on 01/21/2014 impression: LVEF 65% without wall motion abnormalities  Bilateral carotid Dopplers performed on 01/21/2014 impression: 139% ICA stenosis  Consultations:  Neurology  Orthopedic surgery  Discharge Exam: Filed Vitals:   01/23/14 0637  BP: 141/62  Pulse: 81  Temp: 99 F (37.2 C)  Resp: 16    General: Patient is awake, alert, no acute distress, wasn't cooperative Cardiovascular: Regular rate and rhythm normal S1-S2, no extremity edema Respiratory: Lungs are clear  to auscultation bilaterally no wheezing rhonchi or rales Abdomen: Soft nontender nondistended Musculoskeletal: Patient having pain over left hip region, continues to have pain with weightbearing to left lower extremity   Discharge Instructions You were cared for by a hospitalist during your hospital stay. If you have any questions about your discharge medications or the care you received while you were in the hospital after you are discharged, you can call the unit and asked to speak with the hospitalist on call if the hospitalist that took care of you is not available. Once you are discharged, your primary care physician will handle any further medical issues. Please note that NO REFILLS for any discharge medications will be authorized once you are discharged, as it is imperative that you return to your primary care physician (or establish a relationship with a primary care physician if you do not have one) for your aftercare needs so that they can reassess your need for medications and monitor your lab values.  Discharge Instructions    Call MD for:  difficulty breathing, headache or visual disturbances    Complete by:  As directed      Call MD for:  extreme fatigue    Complete by:  As directed      Call MD for:  hives    Complete by:  As directed      Call MD for:  persistant dizziness or light-headedness    Complete by:  As directed      Call MD for:  persistant nausea and vomiting    Complete by:  As directed      Call MD for:  redness, tenderness, or signs of infection (pain, swelling, redness, odor or green/yellow discharge around incision site)    Complete by:  As directed      Call MD for:  severe uncontrolled pain    Complete by:  As directed      Call MD for:  temperature >100.4    Complete by:  As directed      Diet - low sodium heart healthy    Complete by:  As directed      Full weight bearing    Complete by:  As directed      Increase activity slowly    Complete by:  As  directed           Current Discharge Medication List    START taking these medications   Details  acetaminophen (TYLENOL) 325 MG tablet Take 2 tablets (650 mg total) by mouth every 6 (six) hours as needed for moderate pain. Qty: 30 tablet, Refills: 0    aspirin 325 MG tablet Take 1 tablet (325 mg total) by mouth daily. Qty: 30 tablet, Refills: 1    oxyCODONE (OXY IR/ROXICODONE) 5 MG immediate release tablet Take 1 tablet (5 mg total) by mouth every 6 (six) hours as needed for breakthrough pain. Qty: 15 tablet, Refills: 0    senna-docusate (SENOKOT-S) 8.6-50 MG per tablet Take 1 tablet by mouth at bedtime as needed for mild constipation. Qty: 30 tablet, Refills: 0      CONTINUE these medications which have NOT CHANGED   Details  valsartan (DIOVAN) 160 MG tablet Take 1 tablet (160 mg total) by mouth daily. Qty: 90 tablet, Refills: 3      STOP taking  these medications     ibuprofen (ADVIL,MOTRIN) 200 MG tablet        Allergies  Allergen Reactions  . Sulfonamide Derivatives Other (See Comments)    Unknown   Follow-up Information    Follow up with Mcarthur Rossetti, MD. Schedule an appointment as soon as possible for a visit in 2 weeks.   Specialty:  Orthopedic Surgery   Contact information:   [place] [address] [phone]       Follow up with CAMPBELL, PADONDA BOYD, FNP In 2 weeks.   Specialty:  Family Medicine   Contact information:   [place] [address] [phone]        The results of significant diagnostics from this hospitalization (including imaging, microbiology, ancillary and laboratory) are listed below for reference.    Significant Diagnostic Studies: Dg Hip Bilateral W/pelvis  01/21/2014   CLINICAL DATA:  Bilateral hip pain post fall at home 01/19/2014, left lateral hip pain  EXAM: BILATERAL HIP WITH PELVIS - 4+ VIEW  COMPARISON:  None.  FINDINGS: Five views bilateral hip submitted. Mild  displaced fracture of the left superior and inferior pubic ramus. Bilateral femoral head is located in acetabular joint. No hip fracture or subluxation.  IMPRESSION: There is mild displaced fracture of the left superior and inferior pubic ramus. No hip fracture or subluxation.   Electronically Signed   By: Lahoma Crocker M.D.   On: 01/21/2014 11:52   Ct Head Wo Contrast  01/20/2014   CLINICAL DATA:  Left leg weakness, difficulty ambulating, dementia, confusion  EXAM: CT HEAD WITHOUT CONTRAST  TECHNIQUE: Contiguous axial images were obtained from the base of the skull through the vertex without intravenous contrast.  COMPARISON:  None.  FINDINGS: No evidence of parenchymal hemorrhage or extra-axial fluid collection. No mass lesion, mass effect, or midline shift.  No CT evidence of acute infarction.  Subcortical white matter and periventricular small vessel ischemic changes. Intracranial atherosclerosis.  Global cortical atrophy.  Secondary mild ventricular prominence.  The visualized paranasal sinuses are essentially clear. Partial opacification of the right mastoid air cells.  No evidence of calvarial fracture.  IMPRESSION: No evidence of acute intracranial abnormality.  Atrophy with small vessel ischemic changes and intracranial atherosclerosis.   Electronically Signed   By: Julian Hy M.D.   On: 01/20/2014 12:41   Mr Jodene Nam Head Wo Contrast  01/20/2014   CLINICAL DATA:  History of CVA.  Hypertension.  Dementia.  EXAM: MRA HEAD WITHOUT CONTRAST  TECHNIQUE: Angiographic images of the Circle of Willis were obtained using MRA technique without intravenous contrast.  COMPARISON:  CT head 01/20/2014  FINDINGS: Both vertebral arteries are patent to the basilar. The basilar is widely patent. Superior cerebellar and posterior cerebral arteries are patent bilaterally.  Internal carotid artery patent bilaterally. Anterior and middle cerebral arteries are patent without significant stenosis.  Negative for cerebral  aneurysm.  IMPRESSION: Negative MRA head.   Electronically Signed   By: Franchot Gallo M.D.   On: 01/20/2014 18:06   Mr Brain Wo Contrast  01/21/2014   CLINICAL DATA:  Stroke.  Left leg weakness  EXAM: MRI HEAD WITHOUT CONTRAST  TECHNIQUE: Multiplanar, multiecho pulse sequences of the brain and surrounding structures were obtained without intravenous contrast.  COMPARISON:  MRA head 01/20/2014.  CT head 01/20/2014  FINDINGS: Moderate atrophy.  Negative for acute infarct  Chronic microvascular ischemic changes in the white matter of a moderate degree. Mild chronic ischemia in the pons.  Chronic micro hemorrhage in the right medial occipital lobe. No other hemorrhage or fluid collection  Negative for mass or edema.  IMPRESSION: Atrophy and chronic microvascular ischemia.  No acute abnormality.   Electronically Signed   By: Franchot Gallo M.D.   On: 01/21/2014 15:13   US Renal  01/20/2014   CLINICAL DATA:  80 year old female with acute renal failure  EXAM: RENAL/URINARY TRACT ULTRASOUND COMPLETE  COMPARISON:  None.  FINDINGS: Right Kidney:  Length: 9.5 cm. Mildly echogenic renal parenchyma. Anechoic simple cyst in the upper pole measures 1.2 x 1.0 x 1.2 cm. There is a second small anechoic cyst at the lower pole which measures 1.0 x 0.8 x 0.9 cm. No focal solid lesion. No evidence of hydronephrosis.  Left Kidney:  Length: 8.5 cm. Mildly echogenic renal parenchyma. No hydronephrosis. 2.6 x 3.1 x 2.1 cm sonographically simple cyst in the interpolar kidney. Large anechoic simple cyst exophytic from the upper pole measures 5.2 x 3.9 x 4.7 cm.  Bladder:  Appears normal for degree of bladder distention.  IMPRESSION: 1. No evidence of hydronephrosis. 2. Echogenic renal parenchymal bilaterally suggests underlying medical renal disease. 3. Kidneys are at the lower limits of normal for size. No focal renal cortical scarring. 4. Bilateral simple renal cysts.   Electronically Signed   By: Jacqulynn Cadet M.D.   On:  01/20/2014 21:55    Microbiology: No results found for this or any previous visit (from the past 240 hour(s)).   Labs: Basic Metabolic Panel:  Recent Labs Lab 01/20/14 1150 01/21/14 0800 01/22/14 0415  NA 138 144 139  K 4.6 3.0* 5.2  CL 99 112 107  CO2 23 19 23   GLUCOSE 109* 70 89  BUN 39* 28* 21  CREATININE 3.26* 1.62* 1.14*  CALCIUM 9.6 6.7* 8.6   Liver Function Tests:  Recent Labs Lab 01/20/14 1150  AST 51*  ALT 26  ALKPHOS 78  BILITOT 0.9  PROT 7.7  ALBUMIN 4.3   No results for input(s): LIPASE, AMYLASE in the last 168 hours. No results for input(s): AMMONIA in the last 168 hours. CBC:  Recent Labs Lab 01/20/14 1150  WBC 14.6*  NEUTROABS 12.2*  HGB 10.2*  HCT 29.6*  MCV 89.4  PLT 155   Cardiac Enzymes:  Recent Labs Lab 01/20/14 1540 01/21/14 0305  CKTOTAL 1575* 1456*  CKMB 22.7* 22.1*   BNP: BNP (last 3 results) No results for input(s): PROBNP in the last 8760 hours. CBG: No results for input(s): GLUCAP in the last 168 hours.     SignedKelvin Cellar  [facility] 01/23/2014, 10:02 AM

## 2014-01-23 NOTE — Clinical Social Work Note (Signed)
Clinical Social Worker facilitated patient discharge including contacting patient family and facility to confirm patient discharge plans.  Clinical information faxed to facility and family agreeable with plan.  CSW arranged ambulance transport via [facility] to [facility].  RN to call report prior to discharge.  Clinical Social Worker will sign off for now as social work intervention is no longer needed. Please consult [place] again if new need arises.  Glendon Axe, MSW, LCSWA [phone] 01/23/2014 3:53 PM

## 2014-01-23 NOTE — Progress Notes (Signed)
Physical Therapy Treatment Patient Details Name: Sylvia Villarreal MRN: [id number] DOB: 02-Feb-1925 Today's Date: 01/23/2014    History of Present Illness Adm 01/20/14 s/p fall with Lt superior and inferior pubic rami fractures. PMHx-dementia, osteoporosis, remote CVA, breast cancer    PT Comments    Pt much less painful this date and tolerating some WB'g through LLE. No delusional thoughts today.  Follow Up Recommendations  SNF;Supervision/Assistance - 24 hour     Equipment Recommendations  Rolling walker with 5" wheels    Recommendations for Other Services       Precautions / Restrictions Precautions Precautions: Fall Restrictions Weight Bearing Restrictions: Yes LLE Weight Bearing: Weight bearing as tolerated    Mobility  Bed Mobility Overal bed mobility: Needs Assistance Bed Mobility: Supine to Sit;Sit to Supine     Supine to sit: Min assist;HOB elevated Sit to supine: Min assist   General bed mobility comments: HOB 20; pt required assist only with moving LLE  Transfers Overall transfer level: Needs assistance Equipment used: Rolling walker (2 wheeled) Transfers: Sit to/from Stand Sit to Stand: Min assist         General transfer comment: vc for safe use of RW; repeated x 2  Ambulation/Gait Ambulation/Gait assistance: Min assist;+2 safety/equipment Ambulation Distance (Feet): 8 Feet (4 forward, 4 backward) Assistive device: Rolling walker (2 wheeled) Gait Pattern/deviations: Step-to pattern;Decreased stride length;Decreased dorsiflexion - left;Antalgic (vaulting with RLE to clear LLE)     General Gait Details: pt initially fearful, however tolerating WB on LLE far better   Stairs            Wheelchair Mobility    Modified Rankin (Stroke Patients Only)       Balance             Standing balance-Leahy Scale: Poor                      Cognition Arousal/Alertness: Awake/alert Behavior During Therapy: WFL for tasks  assessed/performed Overall Cognitive Status: History of cognitive impairments - at baseline                 General Comments: No delusions today    Exercises General Exercises - Lower Extremity Ankle Circles/Pumps: AROM;Both;10 reps;Seated Heel Slides: AAROM;Both;5 reps;Supine    General Comments        Pertinent Vitals/Pain Pain Assessment: Faces Faces Pain Scale: Hurts little more Pain Location: lt pelvis Pain Intervention(s): Limited activity within patient's tolerance;Monitored during session;Repositioned    Home Living                      Prior Function            PT Goals (current goals can now be found in the care plan section) Acute Rehab PT Goals Patient Stated Goal: be able to return home alone after she has healed Progress towards PT goals: Progressing toward goals    Frequency  Min 3X/week    PT Plan Current plan remains appropriate    Co-evaluation             End of Session Equipment Utilized During Treatment: Gait belt Activity Tolerance: Patient tolerated treatment well Patient left: with call bell/phone within reach;in bed;with bed alarm set (return to bed for nursing procedure)     Time: 2992-4268 PT Time Calculation (min) (ACUTE ONLY): 16 min  Charges:  $Gait Training: 8-22 mins  G Codes:      Cooper Moroney 01/23/2014, 3:29 PM Pager [phone]

## 2014-01-26 ENCOUNTER — Non-Acute Institutional Stay (SKILLED_NURSING_FACILITY): Payer: Medicare Other | Admitting: Internal Medicine

## 2014-01-26 DIAGNOSIS — S32501A Unspecified fracture of right pubis, initial encounter for closed fracture: Secondary | ICD-10-CM | POA: Diagnosis not present

## 2014-01-26 DIAGNOSIS — N179 Acute kidney failure, unspecified: Secondary | ICD-10-CM | POA: Diagnosis not present

## 2014-01-26 DIAGNOSIS — R29898 Other symptoms and signs involving the musculoskeletal system: Secondary | ICD-10-CM | POA: Diagnosis not present

## 2014-01-26 DIAGNOSIS — S32591A Other specified fracture of right pubis, initial encounter for closed fracture: Secondary | ICD-10-CM

## 2014-01-26 DIAGNOSIS — K5900 Constipation, unspecified: Secondary | ICD-10-CM

## 2014-01-26 DIAGNOSIS — I10 Essential (primary) hypertension: Secondary | ICD-10-CM

## 2014-01-27 NOTE — Progress Notes (Signed)
Patient ID: Sylvia Villarreal, female   DOB: 11/27/1924, 80 y.o.   MRN: [id number]     [facility]   PCP: CAMPBELL, PADONDA BOYD, FNP  Code Status: full code  Allergies  Allergen Reactions  . Sulfonamide Derivatives Other (See Comments)    Unknown    Chief Complaint  Patient presents with  . New Admit To SNF     HPI:  80 y/o female pt is here for STR after hospital admission from 01/20/14-01/23/14 with left leg weakness She has past medical history of hypertension, remote CVA in the past without residual deficits, osteoporosis, cognitive impairment/dementia, breast cancer status post mastectomy. She was noted to have closed fracture of her pubic rami. CVA was ruled out. She also had ARF. neurology was consulted, MRI of the brain was negative for acute CVA. Orthopedic was consulted and recommended pain management and PT.  She is seen in her room today with her daughters present. She is in no distress. Her pain is under control. She is working with therapy team. She denies any concerns. bp well controlled in facility. Off oxyIR on family request,on tramadol and this is helping with her pain.  Review of Systems:  Constitutional: Negative for fever, chills, diaphoresis.  HENT: Negative for congestion  Respiratory: Negative for cough, sputum production, shortness of breath and wheezing.   Cardiovascular: Negative for chest pain, palpitations, leg swelling.  Gastrointestinal: Negative for heartburn, nausea, vomiting, abdominal pain, constipation. appetite is good Genitourinary: Negative for dysuria and flank pain.  Musculoskeletal: Negative for back pain, falls Skin: Negative for itching, rash.  Neurological: Negative for weakness,dizziness, tingling, focal weakness and headaches.  Psychiatric/Behavioral: Negative for depression  Past Medical History  Diagnosis Date  . Osteoporosis, unspecified   . Allergic rhinitis, cause unspecified   . Cramp of limb    . Unspecified closed fracture of carpal bone   . Personal history of malignant neoplasm of breast   . Other and unspecified hyperlipidemia   . Unspecified essential hypertension    Past Surgical History  Procedure Laterality Date  . Appendectomy    . Modified radical mastectomy w/ axillary lymph node dissection    . Abdominal hysterectomy    . Oophorectomy     Social History:   reports that she has never smoked. She does not have any smokeless tobacco history on file. She reports that she does not drink alcohol or use illicit drugs.  Family History  Problem Relation Age of Onset  . Alzheimer's disease Mother   . Stroke Father   . Colon cancer Neg Hx   . Breast cancer Neg Hx   . Diabetes Neg Hx     Medications: Patient's Medications  New Prescriptions   No medications on file  Previous Medications   ACETAMINOPHEN (TYLENOL) 325 MG TABLET    Take 2 tablets (650 mg total) by mouth every 6 (six) hours as needed for moderate pain.   ASPIRIN 325 MG TABLET    Take 1 tablet (325 mg total) by mouth daily.   OXYCODONE (OXY IR/ROXICODONE) 5 MG IMMEDIATE RELEASE TABLET    Take 1 tablet (5 mg total) by mouth every 6 (six) hours as needed for breakthrough pain.   SENNA-DOCUSATE (SENOKOT-S) 8.6-50 MG PER TABLET    Take 1 tablet by mouth at bedtime as needed for mild constipation.   VALSARTAN (DIOVAN) 160 MG TABLET    Take 1 tablet (160 mg total) by mouth daily.  Modified Medications   No  medications on file  Discontinued Medications   No medications on file     Physical Exam: Filed Vitals:   01/26/14 1628  BP: 128/65  Pulse: 84  Temp: 97.6 F (36.4 C)  Resp: 18  SpO2: 100%    General- elderly female in no acute distress Head- atraumatic, normocephalic Eyes- PERRLA, EOMI, no pallor, no icterus, no discharge Neck- no cervical lymphadenopathy Cardiovascular- normal s1,s2, no murmurs, good dorsalis pedis Respiratory- bilateral clear to auscultation, no wheeze, no rhonchi, no  crackles, no use of accessory muscles Abdomen- bowel sounds present, soft, non tender Musculoskeletal- able to move all 4 extremities, leg leg ROM limited with pain in hip area, no leg edema, using walker and wheelchair Neurological- no focal deficit Skin- warm and dry Psychiatry- alert and oriented to person, place and time, normal mood and affect    Labs reviewed: Basic Metabolic Panel:  Recent Labs  01/20/14 1150 01/21/14 0800 01/22/14 0415  NA 138 144 139  K 4.6 3.0* 5.2  CL 99 112 107  CO2 23 19 23   GLUCOSE 109* 70 89  BUN 39* 28* 21  CREATININE 3.26* 1.62* 1.14*  CALCIUM 9.6 6.7* 8.6   Liver Function Tests:  Recent Labs  01/20/14 1150  AST 51*  ALT 26  ALKPHOS 78  BILITOT 0.9  PROT 7.7  ALBUMIN 4.3   No results for input(s): LIPASE, AMYLASE in the last 8760 hours. No results for input(s): AMMONIA in the last 8760 hours. CBC:  Recent Labs  01/20/14 1150  WBC 14.6*  NEUTROABS 12.2*  HGB 10.2*  HCT 29.6*  MCV 89.4  PLT 155   Cardiac Enzymes:  Recent Labs  01/20/14 1540 01/21/14 0305  CKTOTAL 1575* 1456*  CKMB 22.7* 22.1*    Assessment/Plan  Left leg weakness Will have patient work with PT/OT as tolerated to regain strength and restore function.  Fall precautions are in place.  Pubic rami fracture Pain controlled with tramadol for now. Monitor clinically. To continue working with therapy team for strength and balance. On aspirin for prophylaxis. Has f/u with dr Ninfa Linden  HTN bp stable, continue valsartan 160 mg daily and aspirin  Constipation Bowel stable, continue senna-s  ARF Monitor renal function  Goals of care: short term rehabilitation   Family/ staff Communication: reviewed care plan with patient and nursing supervisor  Labs/tests ordered: cmp, cbc    Blanchie Serve, MD  [place] [phone] (Monday-Friday 8 am - 5 pm) [phone] (afterhours)

## 2014-02-12 DIAGNOSIS — S32512A Fracture of superior rim of left pubis, initial encounter for closed fracture: Secondary | ICD-10-CM | POA: Diagnosis not present

## 2014-02-14 ENCOUNTER — Encounter: Payer: Self-pay | Admitting: Adult Health

## 2014-02-14 ENCOUNTER — Non-Acute Institutional Stay (SKILLED_NURSING_FACILITY): Payer: Medicare Other | Admitting: Adult Health

## 2014-02-14 DIAGNOSIS — N179 Acute kidney failure, unspecified: Secondary | ICD-10-CM | POA: Diagnosis not present

## 2014-02-14 DIAGNOSIS — R29898 Other symptoms and signs involving the musculoskeletal system: Secondary | ICD-10-CM

## 2014-02-14 DIAGNOSIS — K5900 Constipation, unspecified: Secondary | ICD-10-CM | POA: Diagnosis not present

## 2014-02-14 DIAGNOSIS — R4189 Other symptoms and signs involving cognitive functions and awareness: Secondary | ICD-10-CM | POA: Diagnosis not present

## 2014-02-14 DIAGNOSIS — S32501A Unspecified fracture of right pubis, initial encounter for closed fracture: Secondary | ICD-10-CM | POA: Diagnosis not present

## 2014-02-14 DIAGNOSIS — I10 Essential (primary) hypertension: Secondary | ICD-10-CM | POA: Diagnosis not present

## 2014-02-14 DIAGNOSIS — S32591A Other specified fracture of right pubis, initial encounter for closed fracture: Secondary | ICD-10-CM

## 2014-02-14 NOTE — Progress Notes (Signed)
Patient ID: Sylvia Villarreal, female   DOB: 12/12/1924, 80 y.o.   MRN: [id number]   02/14/2014  Facility:  Nursing Home Location:  [place] Room Number: 308-P LEVEL OF CARE:  SNF (31)   Chief Complaint  Patient presents with  . Discharge Note    Left leg weakness, Pubic rami fracture, hypertension, constipation, acute renal failure and cognitive impairment    HISTORY OF PRESENT ILLNESS:  This is an 80 year old female who is for discharge home. Home health PT and ST. DME: Rolling walker and bedside commode. She has been admitted to [facility] on 01/23/14 from [facility] with left leg weakness. X-ray of bilateral hip showed mild displaced fracture of left superior and inferior pubic ramus. Ortho was consulted and recommended pain control and physical therapy. She has past medical history of hypertension, remote CVA in the past without proceed well deficits, osteoporosis and cognitive impairment/dementia. Patient was admitted to this facility for short-term rehabilitation after the patient's recent hospitalization.  Patient has completed SNF rehabilitation and therapy has cleared the patient for discharge.   PAST MEDICAL HISTORY:  Past Medical History  Diagnosis Date  . Osteoporosis, unspecified   . Allergic rhinitis, cause unspecified   . Cramp of limb   . Unspecified closed fracture of carpal bone   . Personal history of malignant neoplasm of breast   . Other and unspecified hyperlipidemia   . Unspecified essential hypertension     CURRENT MEDICATIONS: Reviewed per MAR/see medication list  Allergies  Allergen Reactions  . Sulfonamide Derivatives Other (See Comments)    Unknown     REVIEW OF SYSTEMS: unable to obtain, patient is not a good historian  PHYSICAL EXAMINATION  GENERAL: no acute distress, normal body habitus EYES: conjunctivae normal, sclerae normal, normal eye lids NECK: supple, trachea midline, no neck masses, no thyroid  tenderness, no thyromegaly LYMPHATICS: no LAN in the neck, no supraclavicular LAN RESPIRATORY: breathing is even & unlabored, BS CTAB CARDIAC: RRR, no murmur,no extra heart sounds, no edema GI: abdomen soft, normal BS, no masses, no tenderness, no hepatomegaly, no splenomegaly EXTREMITIES:  Able to move all 4 extremities PSYCHIATRIC: the patient is alert & oriented to person, affect & behavior appropriate  LABS/RADIOLOGY: Labs reviewed: Basic Metabolic Panel:  Recent Labs  01/20/14 1150 01/21/14 0800 01/22/14 0415  NA 138 144 139  K 4.6 3.0* 5.2  CL 99 112 107  CO2 23 19 23   GLUCOSE 109* 70 89  BUN 39* 28* 21  CREATININE 3.26* 1.62* 1.14*  CALCIUM 9.6 6.7* 8.6   Liver Function Tests:  Recent Labs  01/20/14 1150  AST 51*  ALT 26  ALKPHOS 78  BILITOT 0.9  PROT 7.7  ALBUMIN 4.3   CBC:  Recent Labs  01/20/14 1150  WBC 14.6*  NEUTROABS 12.2*  HGB 10.2*  HCT 29.6*  MCV 89.4  PLT 155   Lipid Panel:  Recent Labs  01/21/14 0305  HDL 46   Cardiac Enzymes:  Recent Labs  01/20/14 1540 01/21/14 0305  CKTOTAL 1575* 1456*  CKMB 22.7* 22.1*   Dg Hip Bilateral W/pelvis  01/21/2014   CLINICAL DATA:  Bilateral hip pain post fall at home 01/19/2014, left lateral hip pain  EXAM: BILATERAL HIP WITH PELVIS - 4+ VIEW  COMPARISON:  None.  FINDINGS: Five views bilateral hip submitted. Mild displaced fracture of the left superior and inferior pubic ramus. Bilateral femoral head is located in acetabular joint. No hip fracture or  subluxation.  IMPRESSION: There is mild displaced fracture of the left superior and inferior pubic ramus. No hip fracture or subluxation.   Electronically Signed   By: Lahoma Crocker M.D.   On: 01/21/2014 11:52   Ct Head Wo Contrast  01/20/2014   CLINICAL DATA:  Left leg weakness, difficulty ambulating, dementia, confusion  EXAM: CT HEAD WITHOUT CONTRAST  TECHNIQUE: Contiguous axial images were obtained from the base of the skull through the vertex  without intravenous contrast.  COMPARISON:  None.  FINDINGS: No evidence of parenchymal hemorrhage or extra-axial fluid collection. No mass lesion, mass effect, or midline shift.  No CT evidence of acute infarction.  Subcortical white matter and periventricular small vessel ischemic changes. Intracranial atherosclerosis.  Global cortical atrophy.  Secondary mild ventricular prominence.  The visualized paranasal sinuses are essentially clear. Partial opacification of the right mastoid air cells.  No evidence of calvarial fracture.  IMPRESSION: No evidence of acute intracranial abnormality.  Atrophy with small vessel ischemic changes and intracranial atherosclerosis.   Electronically Signed   By: Julian Hy M.D.   On: 01/20/2014 12:41   Mr Jodene Nam Head Wo Contrast  01/20/2014   CLINICAL DATA:  History of CVA.  Hypertension.  Dementia.  EXAM: MRA HEAD WITHOUT CONTRAST  TECHNIQUE: Angiographic images of the Circle of Willis were obtained using MRA technique without intravenous contrast.  COMPARISON:  CT head 01/20/2014  FINDINGS: Both vertebral arteries are patent to the basilar. The basilar is widely patent. Superior cerebellar and posterior cerebral arteries are patent bilaterally.  Internal carotid artery patent bilaterally. Anterior and middle cerebral arteries are patent without significant stenosis.  Negative for cerebral aneurysm.  IMPRESSION: Negative MRA head.   Electronically Signed   By: Franchot Gallo M.D.   On: 01/20/2014 18:06   Mr Brain Wo Contrast  01/21/2014   CLINICAL DATA:  Stroke.  Left leg weakness  EXAM: MRI HEAD WITHOUT CONTRAST  TECHNIQUE: Multiplanar, multiecho pulse sequences of the brain and surrounding structures were obtained without intravenous contrast.  COMPARISON:  MRA head 01/20/2014.  CT head 01/20/2014  FINDINGS: Moderate atrophy.  Negative for acute infarct  Chronic microvascular ischemic changes in the white matter of a moderate degree. Mild chronic ischemia in the pons.   Chronic micro hemorrhage in the right medial occipital lobe. No other hemorrhage or fluid collection  Negative for mass or edema.  IMPRESSION: Atrophy and chronic microvascular ischemia.  No acute abnormality.   Electronically Signed   By: Franchot Gallo M.D.   On: 01/21/2014 15:13   US Renal  01/20/2014   CLINICAL DATA:  80 year old female with acute renal failure  EXAM: RENAL/URINARY TRACT ULTRASOUND COMPLETE  COMPARISON:  None.  FINDINGS: Right Kidney:  Length: 9.5 cm. Mildly echogenic renal parenchyma. Anechoic simple cyst in the upper pole measures 1.2 x 1.0 x 1.2 cm. There is a second small anechoic cyst at the lower pole which measures 1.0 x 0.8 x 0.9 cm. No focal solid lesion. No evidence of hydronephrosis.  Left Kidney:  Length: 8.5 cm. Mildly echogenic renal parenchyma. No hydronephrosis. 2.6 x 3.1 x 2.1 cm sonographically simple cyst in the interpolar kidney. Large anechoic simple cyst exophytic from the upper pole measures 5.2 x 3.9 x 4.7 cm.  Bladder:  Appears normal for degree of bladder distention.  IMPRESSION: 1. No evidence of hydronephrosis. 2. Echogenic renal parenchymal bilaterally suggests underlying medical renal disease. 3. Kidneys are at the lower limits of normal for size. No focal renal cortical scarring.  4. Bilateral simple renal cysts.   Electronically Signed   By: Jacqulynn Cadet M.D.   On: 01/20/2014 21:55    ASSESSMENT/PLAN:  Left leg weakness - for home health PT Pubic rami fracture - for home health PT, continue tramadol every 6 hours when necessary pain for pain and aspirin 325 mg by mouth daily for DVT prophylaxis; follow-up with Dr. Ninfa Linden Cognitive impairment - for home health ST Hypertension - well controlled; continue losartan 100 mg by mouth daily Constipation - continue senna S by mouth daily at bedtime when necessary Acute renal failure - monitor BMP    I have filled out patient's discharge paperwork and written prescriptions.  Patient will receive home  health PT and ST.  DME provided: Rolling walker and bedside commode  Total discharge time: Greater than 30 minutes  Discharge time involved coordination of the discharge process with social worker, nursing staff and therapy department. Medical justification for home health services/DME verified.     [facility], NP [employer] [phone]

## 2014-03-19 ENCOUNTER — Telehealth: Payer: Self-pay | Admitting: Family

## 2014-03-19 NOTE — Telephone Encounter (Signed)
Pt request refill valsartan (DIOVAN) 160 MG tablet Pt has appt 03/27/14 but will be out of meds by then and request refill to get her through. [facility] [address]

## 2014-03-19 NOTE — Telephone Encounter (Signed)
Rx was sent in 08/2013 for a 90 day supply with 3 refills. Shouldn't need refill until July.  Spoke with Daughter and she has picked up the refill from the pharmacy

## 2014-03-20 ENCOUNTER — Ambulatory Visit: Payer: Medicare Other | Admitting: Family

## 2014-03-27 ENCOUNTER — Ambulatory Visit (INDEPENDENT_AMBULATORY_CARE_PROVIDER_SITE_OTHER): Payer: Medicare Other | Admitting: Family

## 2014-03-27 ENCOUNTER — Encounter: Payer: Self-pay | Admitting: Family

## 2014-03-27 ENCOUNTER — Telehealth: Payer: Self-pay | Admitting: Internal Medicine

## 2014-03-27 VITALS — BP 158/80 | HR 78 | Temp 98.7°F | Ht 61.0 in | Wt 99.3 lb

## 2014-03-27 DIAGNOSIS — F0390 Unspecified dementia without behavioral disturbance: Secondary | ICD-10-CM

## 2014-03-27 DIAGNOSIS — I10 Essential (primary) hypertension: Secondary | ICD-10-CM

## 2014-03-27 MED ORDER — VALSARTAN 160 MG PO TABS: 160.0000 mg | ORAL_TABLET | Freq: Every day | ORAL | Status: DC

## 2014-03-27 NOTE — Progress Notes (Signed)
Pre visit review using our clinic review tool, if applicable. No additional management support is needed unless otherwise documented below in the visit note. 

## 2014-03-27 NOTE — Telephone Encounter (Signed)
Pt Sylvia Villarreal  Want to know will you except her ask a pt. Her daughter and niece are pt of dr Regis Bill.

## 2014-03-27 NOTE — Progress Notes (Signed)
   Subjective:    Patient ID: Sylvia Villarreal, female    DOB: Oct 09, 1924, 81 y.o.   MRN: [id number]  HPI  81 year old African-American female, nonsmoker with a history of hypertension, dementia is in today for recheck. Reports she is doing well. Denies any concerns. Was hospitalized in November after a fall but is much better. She underwent physical therapy and had home health for a limited time because she was doing better. Continues to decline medications to help with memory.  Review of Systems  Constitutional: Negative.   HENT: Negative.   Respiratory: Negative.   Cardiovascular: Negative.   Gastrointestinal: Negative.   Endocrine: Negative.   Genitourinary: Negative.   Musculoskeletal: Negative.   Skin: Negative.   Allergic/Immunologic: Negative.   Neurological: Negative.   Hematological: Negative.   Psychiatric/Behavioral: Negative.        Dementia       Objective:   Physical Exam  Constitutional: She is oriented to person, place, and time. She appears well-developed and well-nourished.  HENT:  Right Ear: External ear normal.  Left Ear: External ear normal.  Nose: Nose normal.  Mouth/Throat: Oropharynx is clear and moist.  Neck: Normal range of motion. Neck supple.  Cardiovascular: Normal rate, regular rhythm and normal heart sounds.   Pulmonary/Chest: Effort normal and breath sounds normal.  Abdominal: Soft. Bowel sounds are normal.  Musculoskeletal: Normal range of motion.  Neurological: She is alert and oriented to person, place, and time.  Skin: Skin is warm and dry.  Psychiatric: She has a normal mood and affect.          Assessment & Plan:  Sylvia Villarreal was seen today for follow-up.  Diagnoses and associated orders for this visit:  Essential hypertension  Dementia, without behavioral disturbance  Other Orders - valsartan (DIOVAN) 160 MG tablet; Take 1 tablet (160 mg total) by mouth daily.    3 Encouraged healthy diet and exercise. Continue current  medications. Follow-up in 6 months and sooner as needed.

## 2014-03-27 NOTE — Patient Instructions (Signed)

## 2014-04-05 NOTE — Telephone Encounter (Signed)
No  ;Practice does not have capacity  for new medicare

## 2014-06-01 NOTE — Telephone Encounter (Signed)
Pt is aware.  

## 2014-06-18 DIAGNOSIS — B351 Tinea unguium: Secondary | ICD-10-CM | POA: Diagnosis not present

## 2014-06-18 DIAGNOSIS — M79674 Pain in right toe(s): Secondary | ICD-10-CM | POA: Diagnosis not present

## 2014-06-18 DIAGNOSIS — M79675 Pain in left toe(s): Secondary | ICD-10-CM | POA: Diagnosis not present

## 2014-09-07 ENCOUNTER — Ambulatory Visit: Payer: Medicare Other | Admitting: Podiatry

## 2014-09-21 ENCOUNTER — Encounter: Payer: Self-pay | Admitting: Family

## 2014-09-21 ENCOUNTER — Ambulatory Visit (INDEPENDENT_AMBULATORY_CARE_PROVIDER_SITE_OTHER): Payer: Medicare Other | Admitting: Family

## 2014-09-21 VITALS — BP 140/68 | HR 70 | Temp 98.2°F | Ht 61.0 in | Wt 111.4 lb

## 2014-09-21 DIAGNOSIS — I10 Essential (primary) hypertension: Secondary | ICD-10-CM

## 2014-09-21 DIAGNOSIS — H6121 Impacted cerumen, right ear: Secondary | ICD-10-CM

## 2014-09-21 DIAGNOSIS — I491 Atrial premature depolarization: Secondary | ICD-10-CM

## 2014-09-21 LAB — BASIC METABOLIC PANEL
BUN: 16 mg/dL (ref 6–23)
CHLORIDE: 104 meq/L (ref 96–112)
CO2: 30 mEq/L (ref 19–32)
Calcium: 9.4 mg/dL (ref 8.4–10.5)
Creatinine, Ser: 1.07 mg/dL (ref 0.40–1.20)
GFR: 62.17 mL/min (ref >60.00)
Glucose, Bld: 86 mg/dL (ref 70–99)
Potassium: 4.6 mEq/L (ref 3.5–5.1)
SODIUM: 141 meq/L (ref 135–145)

## 2014-09-21 LAB — CBC WITH DIFFERENTIAL/PLATELET
BASOS ABS: 0 10*3/uL (ref 0.0–0.1)
Basophils Relative: 0.1 % (ref 0.0–3.0)
EOS PCT: 2.2 % (ref 0.0–5.0)
Eosinophils Absolute: 0.1 10*3/uL (ref 0.0–0.7)
HEMATOCRIT: 38 % (ref 36.0–46.0)
Hemoglobin: 12.8 g/dL (ref 12.0–15.0)
Lymphocytes Relative: 34.1 % (ref 12.0–46.0)
Lymphs Abs: 1.9 10*3/uL (ref 0.7–4.0)
MCHC: 33.6 g/dL (ref 30.0–36.0)
MCV: 95.7 fl (ref 78.0–100.0)
Monocytes Absolute: 0.5 10*3/uL (ref 0.1–1.0)
Monocytes Relative: 8.7 % (ref 3.0–12.0)
NEUTROS ABS: 3 10*3/uL (ref 1.4–7.7)
NEUTROS PCT: 54.9 % (ref 43.0–77.0)
Platelets: 244 10*3/uL (ref 150.0–400.0)
RBC: 3.97 Mil/uL (ref 3.87–5.11)
RDW: 13 % (ref 11.5–15.5)
WBC: 5.4 10*3/uL (ref 4.0–10.5)

## 2014-09-21 MED ORDER — VALSARTAN 160 MG PO TABS: 160.0000 mg | ORAL_TABLET | Freq: Every day | ORAL | Status: DC

## 2014-09-21 NOTE — Progress Notes (Signed)
Pre visit review using our clinic review tool, if applicable. No additional management support is needed unless otherwise documented below in the visit note. 

## 2014-09-21 NOTE — Progress Notes (Signed)
Subjective:    Patient ID: Sylvia Villarreal, female    DOB: Feb 07, 1925, 81 y.o.   MRN: [id number] HPI Comments:  81 year old female in today for blood pressure recheck for refills on medications.  States that she has no c/o shortness of breath or heart problems.  Patient states that she has no pain today. Has concerns of right ear feeling clogged  For a couple weeks. Daughter reports her putting Vaseline in her ears.      Review of Systems  Constitutional: Negative.   HENT: Negative.   Eyes: Negative.   Respiratory: Negative.   Cardiovascular: Negative.   Gastrointestinal: Negative.   Endocrine: Negative.   Genitourinary: Negative.   Musculoskeletal: Negative.   Skin: Negative.   Allergic/Immunologic: Negative.   Neurological: Negative.   Hematological: Negative.   Psychiatric/Behavioral: Negative.    Past Medical History  Diagnosis Date  . Osteoporosis, unspecified   . Allergic rhinitis, cause unspecified   . Cramp of limb   . Unspecified closed fracture of carpal bone   . Personal history of malignant neoplasm of breast   . Other and unspecified hyperlipidemia   . Unspecified essential hypertension     History   Social History  . Marital Status: Married    Spouse Name: N/A  . Number of Children: N/A  . Years of Education: N/A   Occupational History  . Not on file.   Social History Main Topics  . Smoking status: Never Smoker   . Smokeless tobacco: Not on file  . Alcohol Use: No  . Drug Use: No  . Sexual Activity: Not on file   Other Topics Concern  . 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 2 daughters; 5 grandchildren;several great grandchildren. Lives alone and remains independent.  full life alert system in the home. She has bought her burial plot.  ACP - full code and support. [place] (c) [phone]; Nobie Putnam. [place] ([phone] Brent [phone]           Past Surgical History  Procedure Laterality Date  . Appendectomy    . Modified radical mastectomy w/ axillary lymph node dissection    . Abdominal hysterectomy    . Oophorectomy      Family History  Problem Relation Age of Onset  . Alzheimer's disease Mother   . Stroke Father   . Colon cancer Neg Hx   . Breast cancer Neg Hx   . Diabetes Neg Hx     Allergies  Allergen Reactions  . Sulfonamide Derivatives Other (See Comments)    Unknown    No current outpatient prescriptions on file prior to visit.   No current facility-administered medications on file prior to visit.    BP 140/68 mmHg  Pulse 70  Temp(Src) 98.2 F (36.8 C) (Oral)  Ht 5' 1"  (1.549 m)  Wt 111 lb 6 oz (50.519 kg)  BMI 21.05 kg/m2  SpO2 94%chart    Objective:   Physical Exam  Constitutional: She is oriented to person, place, and time. She appears well-developed and well-nourished.  HENT:  Left Ear: External ear normal.  Nose: Nose normal.  Mouth/Throat: Oropharynx is clear and moist.  Cerumen impaction to the right   Eyes: Pupils are equal, round, and reactive to light.  Neck: Normal range of motion.  Cardiovascular: Normal rate, regular rhythm and normal heart sounds.  Intermittent PAC.   Pulmonary/Chest: Effort normal and breath sounds normal.  Abdominal: Soft. Bowel sounds are normal.  Musculoskeletal: Normal range of motion.  Neurological: She is alert and oriented to person, place, and time.  Skin: Skin is warm and dry.  Psychiatric: She has a normal mood and affect.   ECG done today.  Informed consent was obtained and peroxide gel was inserted into the ears bilaterally using the lavage kit the ears were lavaged until clean.Inspection with a cerumen spoon removed residual wax. Patient tolerated the procedure well.      Assessment & Plan:   Diagnoses and all orders for this visit:  Essential hypertension Orders: -     EKG 12-Lead -     Basic Metabolic Panel -     CBC  with Differential -     Cancel: CBC with Differential -     Cancel: CMP  Cerumen impaction, right  Premature atrial contractions  Other orders -     valsartan (DIOVAN) 160 MG tablet; Take 1 tablet (160 mg total) by mouth daily.    Call the office with any questions or concerns. Recheck in 3 months.

## 2014-09-21 NOTE — Patient Instructions (Signed)
Cerumen Impaction °A cerumen impaction is when the wax in your ear forms a plug. This plug usually causes reduced hearing. Sometimes it also causes an earache or dizziness. Removing a cerumen impaction can be difficult and painful. The wax sticks to the ear canal. The canal is sensitive and bleeds easily. If you try to remove a heavy wax buildup with a cotton tipped swab, you may push it in further. °Irrigation with water, suction, and small ear curettes may be used to clear out the wax. If the impaction is fixed to the skin in the ear canal, ear drops may be needed for a few days to loosen the wax. People who build up a lot of wax frequently can use ear wax removal products available in your local drugstore. °SEEK MEDICAL CARE IF:  °You develop an earache, increased hearing loss, or marked dizziness. °Document Released: 03/19/2004 Document Revised: 05/04/2011 Document Reviewed: 05/09/2009 °ExitCare® Patient Information ©2015 [employer]. This information is not intended to replace advice given to you by your health care provider. Make sure you discuss any questions you have with your health care provider. ° °

## 2015-03-14 ENCOUNTER — Ambulatory Visit: Payer: Medicare Other | Admitting: Adult Health

## 2015-05-03 ENCOUNTER — Ambulatory Visit: Payer: Medicare Other | Admitting: Adult Health

## 2015-09-16 ENCOUNTER — Other Ambulatory Visit: Payer: Self-pay | Admitting: Family

## 2015-09-17 NOTE — Telephone Encounter (Signed)
Patient needs to establish care with a new provider in this practice. I did call the patient. [place] was in an airport and the connection was terrible. Patient did state she would return the call when she got home.

## 2015-09-26 NOTE — Telephone Encounter (Signed)
Pt has enough med to last until next week. Pt has an appt with cory on 10-04-15

## 2015-09-26 NOTE — Telephone Encounter (Signed)
Please call this patient and see if you can get them established with another provider.  This is the only way they can get re-fills.  Thanks!

## 2015-10-04 ENCOUNTER — Encounter: Payer: Self-pay | Admitting: Adult Health

## 2015-10-04 ENCOUNTER — Ambulatory Visit (INDEPENDENT_AMBULATORY_CARE_PROVIDER_SITE_OTHER): Payer: Medicare Other | Admitting: Adult Health

## 2015-10-04 VITALS — BP 150/80 | Temp 98.2°F | Ht 61.0 in | Wt 114.8 lb

## 2015-10-04 DIAGNOSIS — Z7189 Other specified counseling: Secondary | ICD-10-CM | POA: Diagnosis not present

## 2015-10-04 DIAGNOSIS — F0390 Unspecified dementia without behavioral disturbance: Secondary | ICD-10-CM

## 2015-10-04 DIAGNOSIS — I10 Essential (primary) hypertension: Secondary | ICD-10-CM | POA: Diagnosis not present

## 2015-10-04 DIAGNOSIS — F22 Delusional disorders: Secondary | ICD-10-CM

## 2015-10-04 DIAGNOSIS — F0392 Unspecified dementia, unspecified severity, with psychotic disturbance: Secondary | ICD-10-CM

## 2015-10-04 DIAGNOSIS — Z7689 Persons encountering health services in other specified circumstances: Secondary | ICD-10-CM

## 2015-10-04 LAB — CBC
HEMATOCRIT: 36.5 % (ref 36.0–46.0)
HEMOGLOBIN: 12.3 g/dL (ref 12.0–15.0)
MCHC: 33.5 g/dL (ref 30.0–36.0)
MCV: 93.2 fl (ref 78.0–100.0)
PLATELETS: 258 10*3/uL (ref 150.0–400.0)
RBC: 3.92 Mil/uL (ref 3.87–5.11)
RDW: 13.2 % (ref 11.5–15.5)
WBC: 4.9 10*3/uL (ref 4.0–10.5)

## 2015-10-04 LAB — BASIC METABOLIC PANEL
BUN: 18 mg/dL (ref 6–23)
CO2: 31 meq/L (ref 19–32)
Calcium: 9.5 mg/dL (ref 8.4–10.5)
Chloride: 105 mEq/L (ref 96–112)
Creatinine, Ser: 1.21 mg/dL — ABNORMAL HIGH (ref 0.40–1.20)
GFR: 53.82 mL/min — ABNORMAL LOW (ref >60.00)
Glucose, Bld: 93 mg/dL (ref 70–99)
POTASSIUM: 4.4 meq/L (ref 3.5–5.1)
SODIUM: 142 meq/L (ref 135–145)

## 2015-10-04 MED ORDER — VALSARTAN 160 MG PO TABS: 160.0000 mg | ORAL_TABLET | Freq: Every day | ORAL | 3 refills | Status: DC

## 2015-10-04 NOTE — Patient Instructions (Signed)
It was great meeting you today!  I will follow up with you regarding your blood work   Please follow up with me in 6 months.  Continue to do whatever you are doing!

## 2015-10-04 NOTE — Progress Notes (Signed)
Patient presents to clinic today to establish care. She is a pleasant and active 82 year old female who  has a past medical history of Allergic rhinitis, cause unspecified; Breast cancer ([place]); Cramp of limb; Osteoporosis, unspecified; Other and unspecified hyperlipidemia; Personal history of malignant neoplasm of breast; Senile dementia; Unspecified closed fracture of carpal bone; and Unspecified essential hypertension.  She presents with her daughter to this visit.   She was a prior patient of Dutch Quint, NP  She continues to live alone and is independent of everything except driving  Acute Concerns: Establish Care  Chronic Issues: Dementia   - Refuses medications. Feels like her memory has stayed stable   Hypertension  - Controlled at 140-150/80's on current medication.   Health Maintenance: Dental -- Does not due routine care Vision -- Does not do routine care Immunizations -- Needs prevnar 23 Colonoscopy -- No longer needs Mammogram -- No longer needs PAP -- No longer Bone Density -- No longer needs   Is not followed by anyone   Exercise: She is active but does not do any formal exercise Diet: Does not follow a specific diet  Past Medical History:  Diagnosis Date  . Allergic rhinitis, cause unspecified   . Breast cancer ([place])   . Cramp of limb   . Osteoporosis, unspecified   . Other and unspecified hyperlipidemia   . Personal history of malignant neoplasm of breast   . Senile dementia   . Unspecified closed fracture of carpal bone   . Unspecified essential hypertension     Past Surgical History:  Procedure Laterality Date  . ABDOMINAL HYSTERECTOMY    . APPENDECTOMY    . MODIFIED RADICAL MASTECTOMY W/ AXILLARY LYMPH NODE DISSECTION    . OOPHORECTOMY      Current Outpatient Prescriptions on File Prior to Visit  Medication Sig Dispense Refill  . Ibuprofen (ADVIL PO) Take by mouth every other day.     No current facility-administered medications on  file prior to visit.     Allergies  Allergen Reactions  . Sulfonamide Derivatives Other (See Comments)    Unknown    Family History  Problem Relation Age of Onset  . Alzheimer's disease Mother   . Stroke Father   . Hypertension Father   . Colon cancer Neg Hx   . Breast cancer Neg Hx   . Diabetes Neg Hx     Social History   Social History  . Marital status: Married    Spouse name: N/A  . Number of children: N/A  . Years of education: N/A   Occupational History  . Not on file.   Social History Main Topics  . Smoking status: Never Smoker  . Smokeless tobacco: Not on file  . Alcohol use No  . Drug use: No  . Sexual activity: Not on file   Other Topics Concern  . 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 2 daughters; 5 grandchildren;several great grandchildren. Lives alone and remains independent.  full life alert system in the home. She has bought her burial plot.  ACP - full code and support. [place] (c) [phone]; Nobie Putnam. [place] ([phone] Brent [id number]          Review of Systems  Constitutional: Negative.   HENT: Negative.   Eyes:       " film over right eye"  Respiratory: Negative.   Cardiovascular: Negative.   Gastrointestinal: Negative.   Genitourinary: Negative.   Musculoskeletal: Negative.   Skin: Negative.   Neurological: Negative.   Endo/Heme/Allergies: Negative.   Psychiatric/Behavioral: Negative.   All other systems reviewed and are negative.   BP (!) 150/80   Temp 98.2 F (36.8 C) (Oral)   Ht 5\' 1"  (1.549 m)   Wt 114 lb 12.8 oz (52.1 kg)   BMI 21.69 kg/m   Physical Exam  Constitutional: She is oriented to person, place, and time and well-developed, well-nourished, and in no distress. No distress.  HENT:  Head: Normocephalic and atraumatic.  Right Ear: External ear normal.  Left Ear: External ear normal.  Nose: Nose  normal.  Mouth/Throat: Oropharynx is clear and moist. No oropharyngeal exudate.  Eyes: Conjunctivae and EOM are normal. Pupils are equal, round, and reactive to light. Right eye exhibits no discharge. Left eye exhibits no discharge. No scleral icterus.  Neck: Normal range of motion. Neck supple. No JVD present. No tracheal deviation present. No thyromegaly present.  Cardiovascular: Normal rate, regular rhythm, normal heart sounds and intact distal pulses.  Exam reveals no gallop and no friction rub.   No murmur heard. Pulmonary/Chest: Effort normal and breath sounds normal. No stridor. No respiratory distress. She has no wheezes. She has no rales. She exhibits no tenderness.  Abdominal: Soft. Bowel sounds are normal. She exhibits no distension and no mass. There is no tenderness. There is no rebound and no guarding.  Musculoskeletal: Normal range of motion. She exhibits no edema, tenderness or deformity.  Easily climbed onto exam table without difficulty.   Lymphadenopathy:    She has no cervical adenopathy.  Neurological: She is alert and oriented to person, place, and time. She displays normal reflexes. No cranial nerve deficit. She exhibits normal muscle tone. Gait normal. Coordination normal. GCS score is 15.  Skin: Skin is warm and dry. No rash noted. She is not diaphoretic. No erythema. No pallor.  Psychiatric: Mood, memory, affect and judgment normal.  Nursing note and vitals reviewed.  Assessment/Plan: 1. Encounter to establish care - Follow up in 6 months for recheck - go to the eye doctor to check on cataracts - Go to the dentist for routine cleaning.   2. Essential hypertension - Due to age I am ok with her BP being in the 0000000 systolic - Basic metabolic panel - CBC - Continue to monitor at home - Follow up in 6 months.   3. Senile dementia with delusional features, without behavioral disturbance - Her memory seems to be stable from reports of family.  - She refuses  medications - is able to due most ADL's without difficulty - She did repeat a few stories twice  - Continue to monitor.  - Consider going to neurology  Dorothyann Peng, NP

## 2015-11-05 DIAGNOSIS — H2513 Age-related nuclear cataract, bilateral: Secondary | ICD-10-CM | POA: Diagnosis not present

## 2016-07-20 IMAGING — CR DG HIP W/ PELVIS BILAT
5 series · 5 of 5 positions shown · non-contrast
Comparison: None.

CLINICAL DATA: Bilateral hip pain post fall at home 01/19/2014,
left lateral hip pain

EXAM:
BILATERAL HIP WITH PELVIS - 4+ VIEW

[pelvis ap]
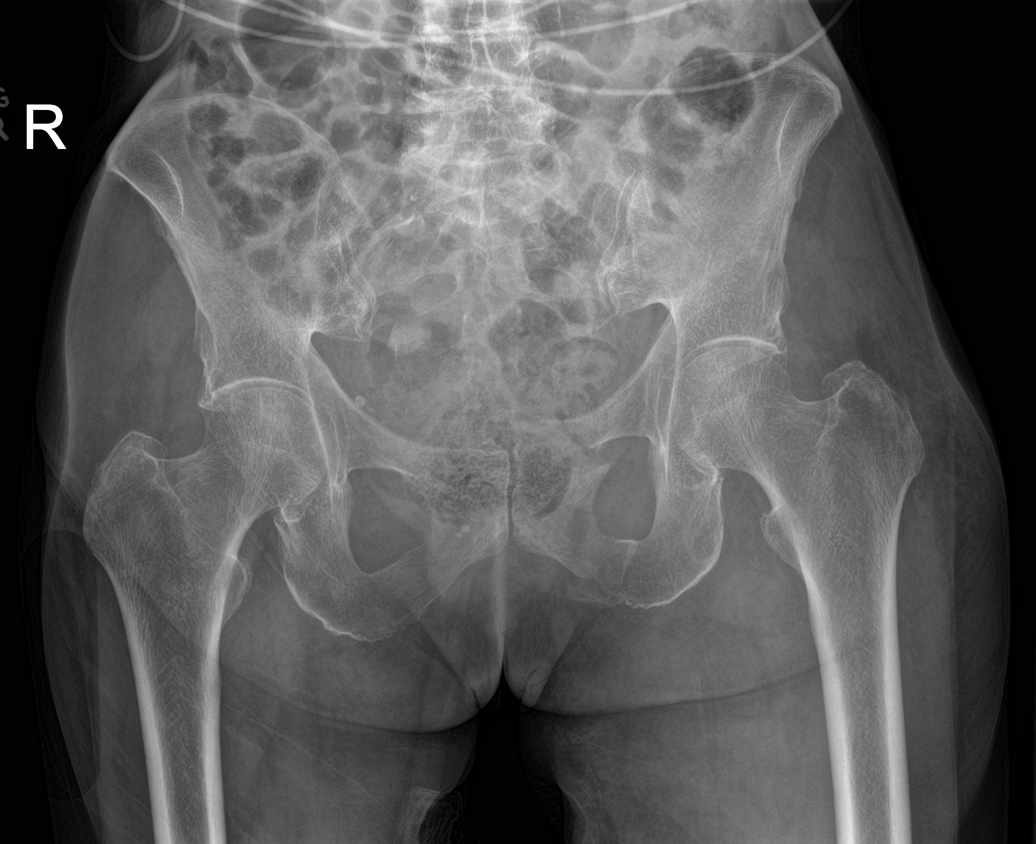

[hip ap (1 of 2)]
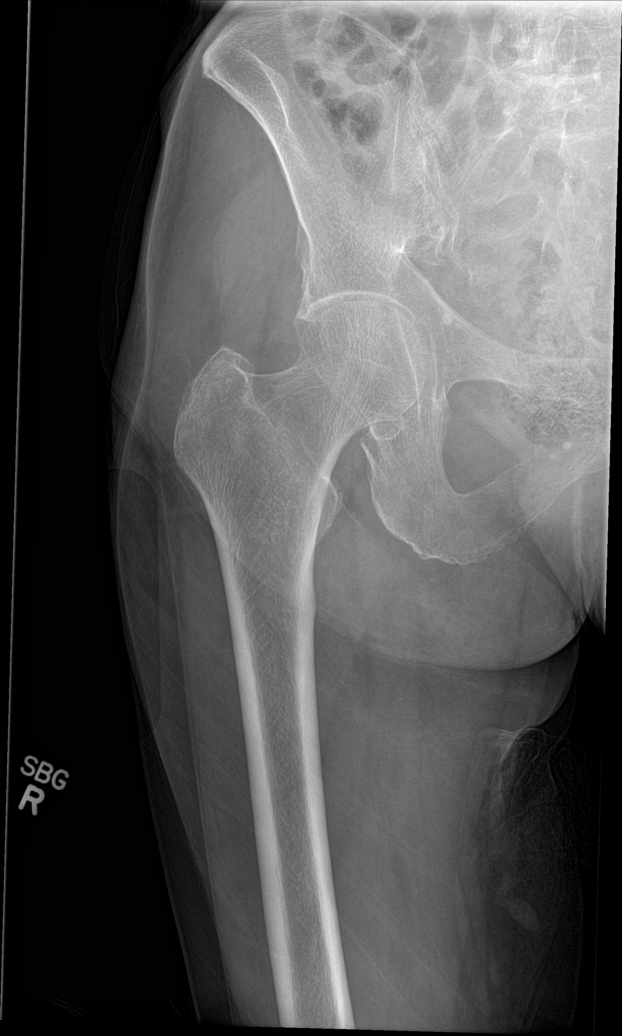

[hip ap (2 of 2)]
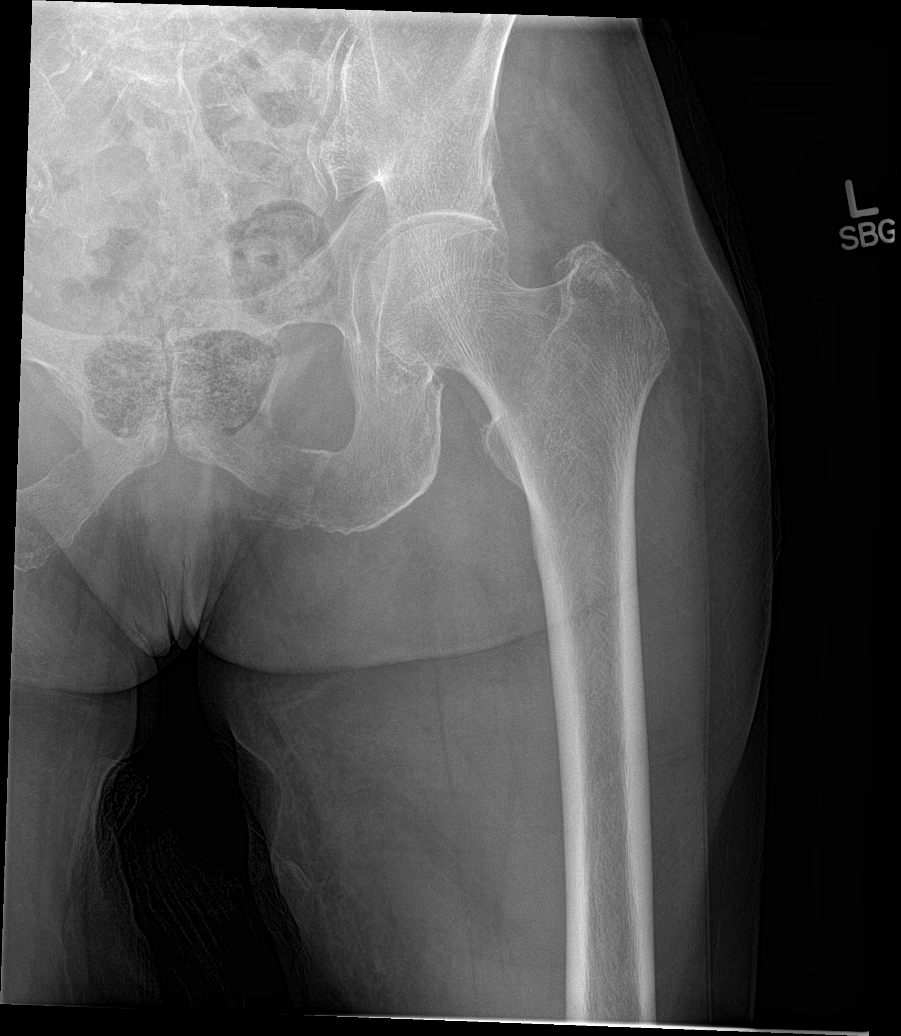

[hip lat (1 of 2)]
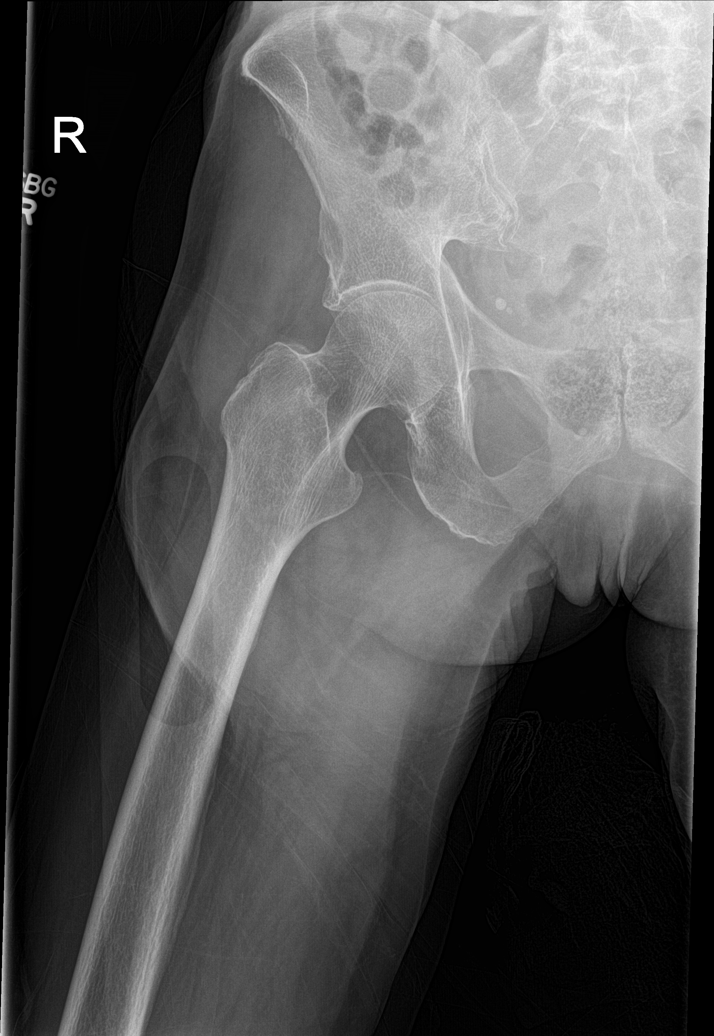

[hip lat (2 of 2)]
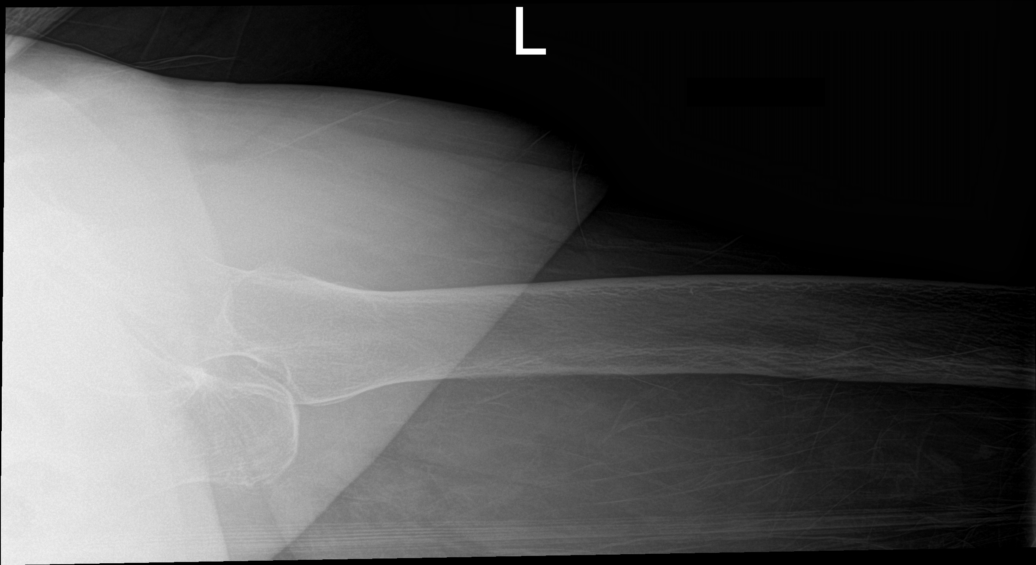

[5 of 5 positions shown; findings below may reference images not displayed]

FINDINGS: Five views bilateral hip submitted. Mild displaced fracture of the
left superior and inferior pubic ramus. Bilateral femoral head is
located in acetabular joint. No hip fracture or subluxation.
IMPRESSION: There is mild displaced fracture of the left superior and inferior
pubic ramus. No hip fracture or subluxation.

## 2016-07-20 IMAGING — MR MR HEAD W/O CM
11 of 12 series · 33 of 48 positions shown · IV contrast (cc    MH)
Comparison: MRA head 01/20/2014.  CT head 01/20/2014

CLINICAL DATA: Stroke.  Left leg weakness

EXAM:
MRI HEAD WITHOUT CONTRAST
TECHNIQUE: Multiplanar, multiecho pulse sequences of the brain and surrounding
structures were obtained without intravenous contrast.

[Series 2: FLAIR · sagittal · 5.0mm · 0.47mm/px · 2 of 25 slices shown (1 of 4)]
[im 1/25]
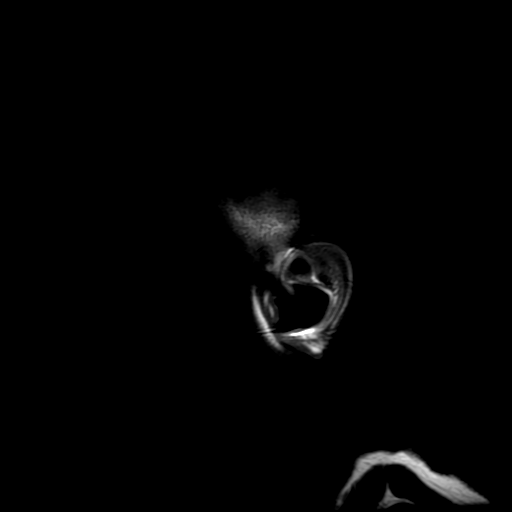
[im 25/25]
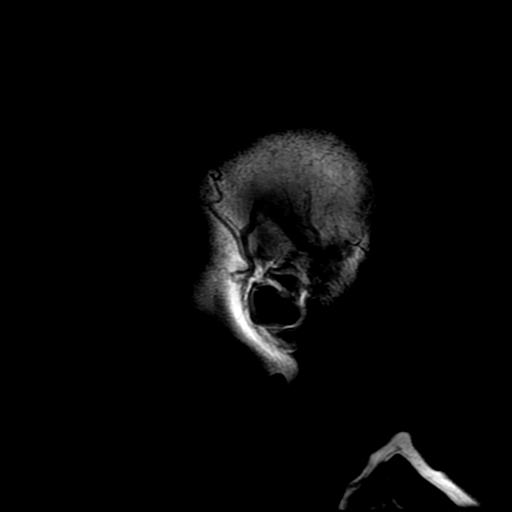

[Series 4: DWI · axial · 3.6mm · 1.02mm/px · z∈[-132,+8]mm · 6 of 82 slices shown (1 of 4)]
[im 1/82]
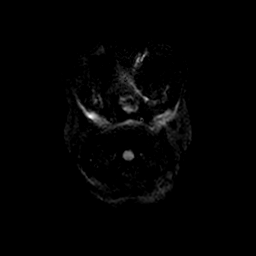
[im 17/82]
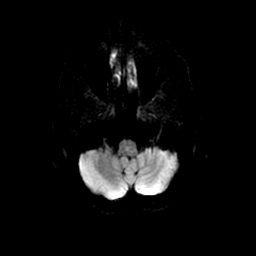
[im 33/82]
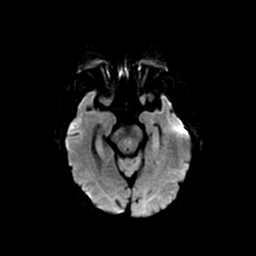
[im 49/82]
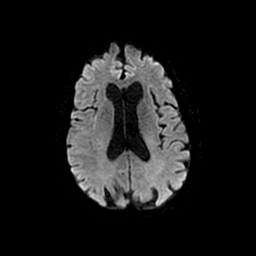
[im 65/82]
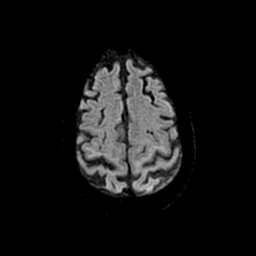
[im 82/82]
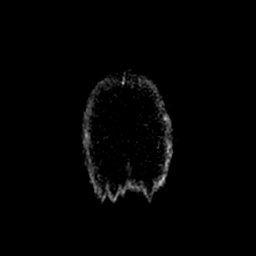

[Series 5: T2 · axial · 5.0mm · 0.43mm/px · z∈[-136,+16]mm · 2 of 27 slices shown]
[im 1/27]
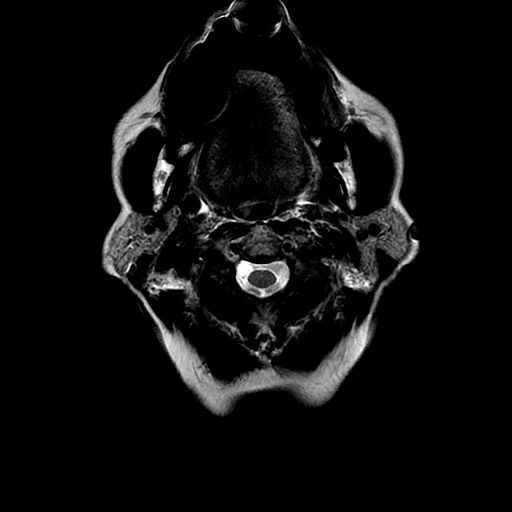
[im 27/27]
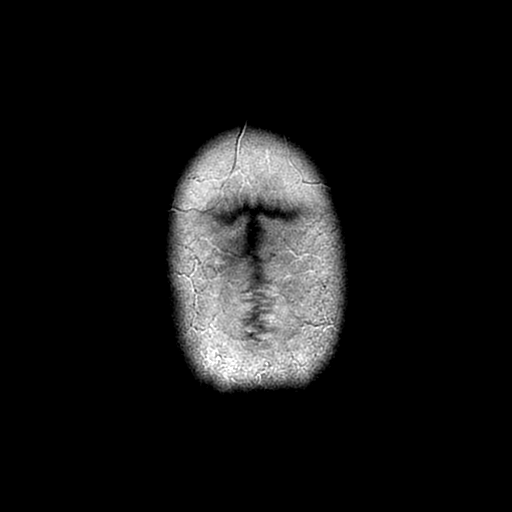

[Series 6: DWI · coronal · 5.0mm · 1.02mm/px · 5 of 74 slices shown (2 of 4)]
[im 1/74]
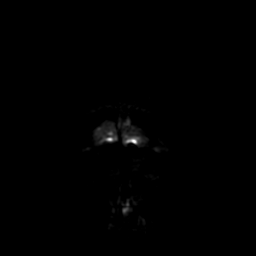
[im 19/74]
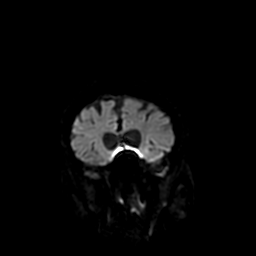
[im 37/74]
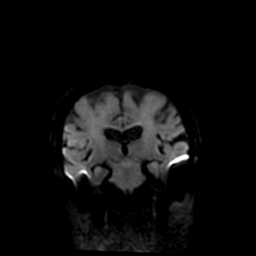
[im 55/74]
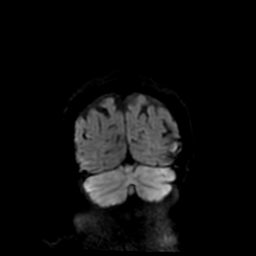
[im 74/74]
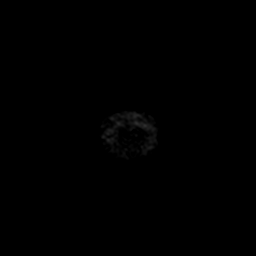

[Series 7: FLAIR · axial · 5.0mm · 0.43mm/px · z∈[-136,+16]mm · 2 of 27 slices shown (2 of 4)]
[im 1/27]
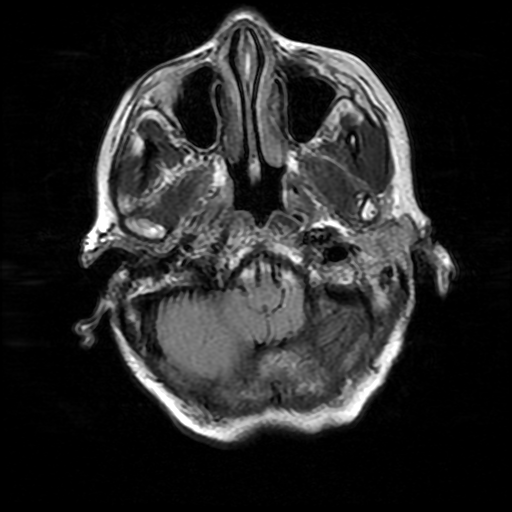
[im 27/27]
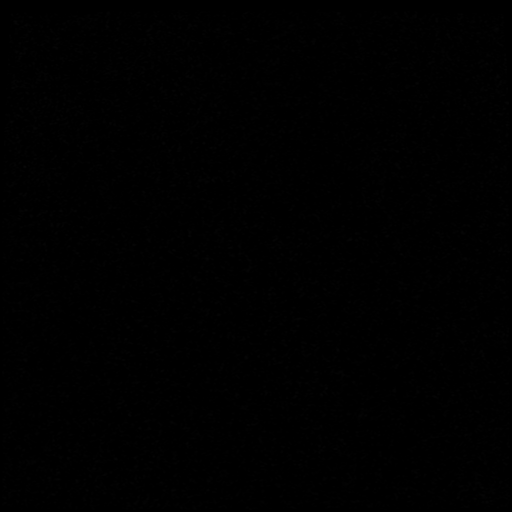

[Series 11: FLAIR · axial · 5.0mm · 0.43mm/px · z∈[-163,-12]mm · 2 of 27 slices shown (3 of 4)]
[im 1/27]
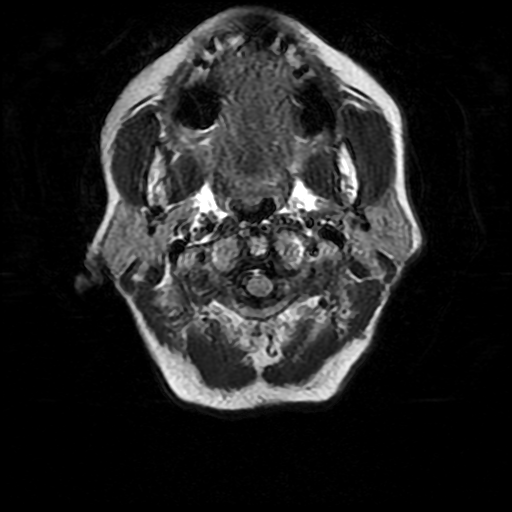
[im 27/27]
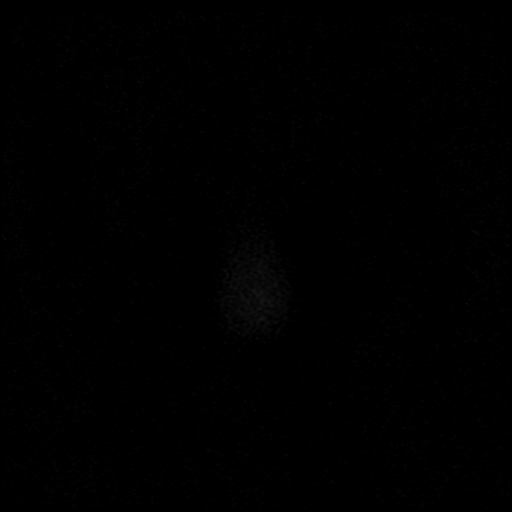

[Series 13: (person_name) · axial · 3.6mm · 0.47mm/px · z∈[-165,-102]mm · 4 of 176 slices shown]
[im 1/176]
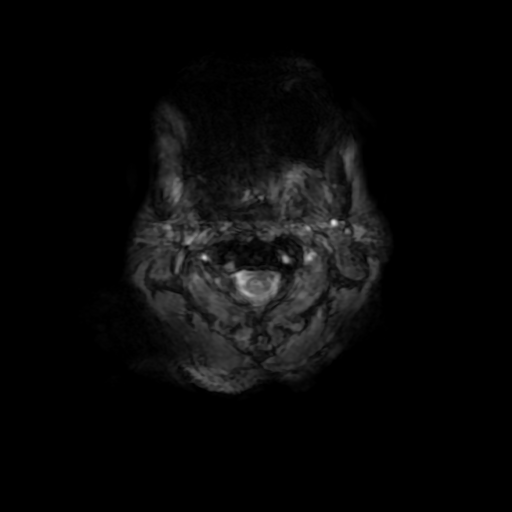
[im 30/176]
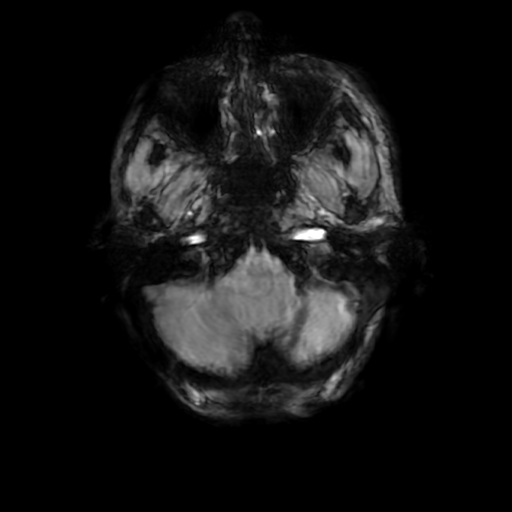
[im 59/176]
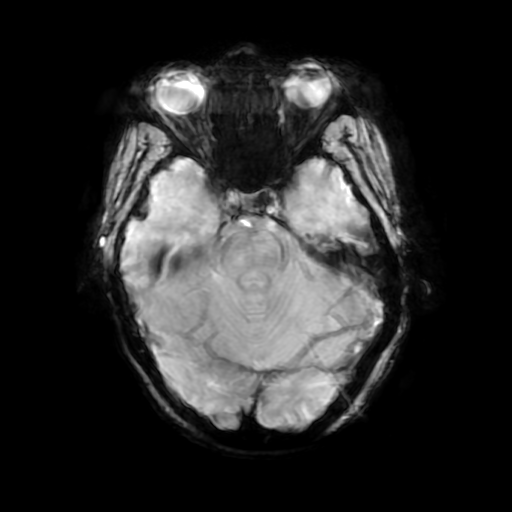
[im 73/176]
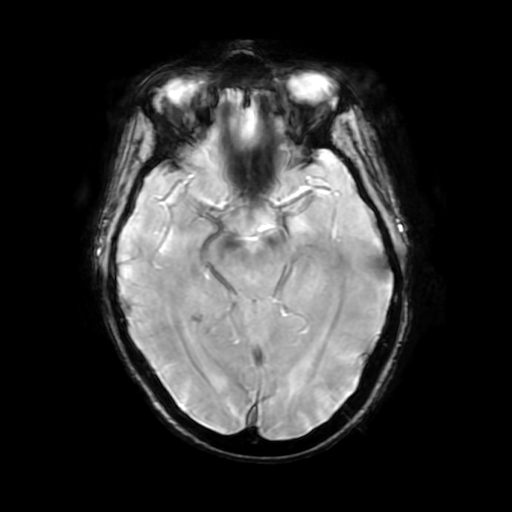

[Series 14: T2 post-contrast · coronal · 5.0mm · 0.47mm/px · 2 of 33 slices shown]
[im 1/33]
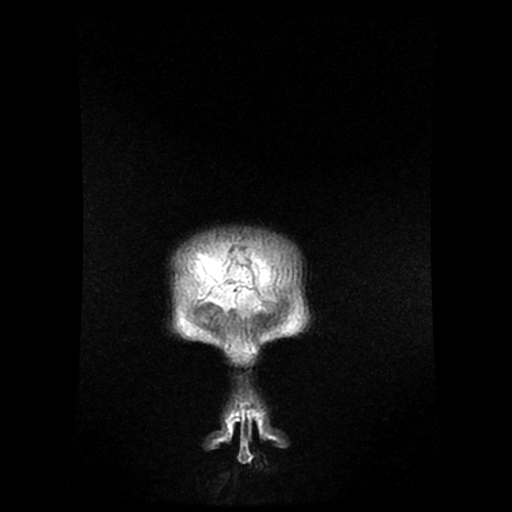
[im 33/33]
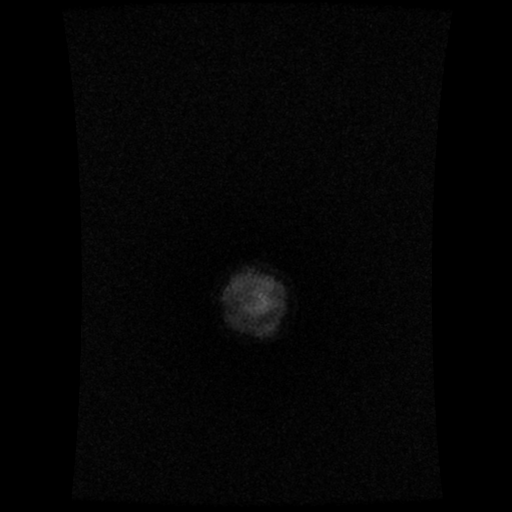

[Series 15: FLAIR · axial · 5.0mm · 0.86mm/px · z∈[-163,-12]mm · 2 of 27 slices shown (4 of 4)]
[im 1/27]
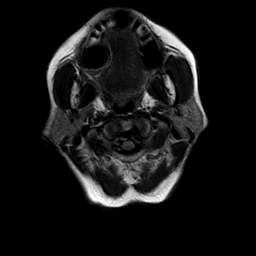
[im 27/27]
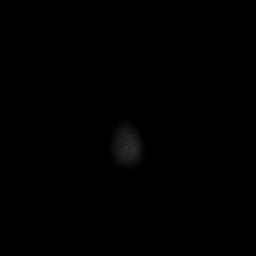

[Series 400: DWI · axial · 3.6mm · 1.02mm/px · z∈[-132,+8]mm · 3 of 41 slices shown (3 of 4)]
[im 1/41]
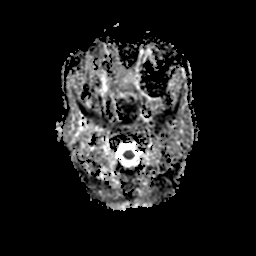
[im 21/41]
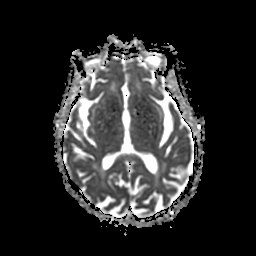
[im 41/41]
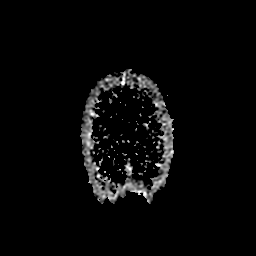

[Series 600: DWI · coronal · 5.0mm · 1.02mm/px · 3 of 36 slices shown (4 of 4)]
[im 1/36]
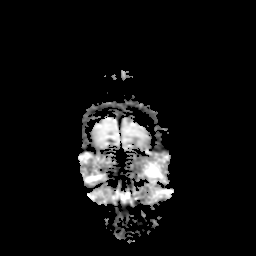
[im 18/36]
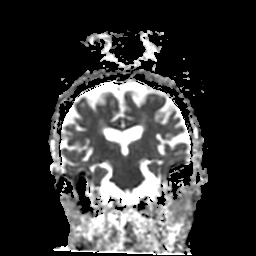
[im 36/36]
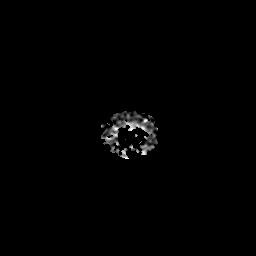

[33 of 48 positions shown; findings below may reference images not displayed]

FINDINGS: Moderate atrophy.

Negative for acute infarct

Chronic microvascular ischemic changes in the white matter of a
moderate degree. Mild chronic ischemia in the pons.

Chronic micro hemorrhage in the right medial occipital lobe. No
other hemorrhage or fluid collection

Negative for mass or edema.
IMPRESSION: Atrophy and chronic microvascular ischemia.  No acute abnormality.

## 2016-08-31 ENCOUNTER — Other Ambulatory Visit: Payer: Self-pay | Admitting: Adult Health

## 2016-09-03 ENCOUNTER — Ambulatory Visit (INDEPENDENT_AMBULATORY_CARE_PROVIDER_SITE_OTHER): Payer: Medicare Other | Admitting: Adult Health

## 2016-09-03 ENCOUNTER — Encounter: Payer: Self-pay | Admitting: Adult Health

## 2016-09-03 VITALS — BP 122/80 | HR 90 | Temp 98.7°F | Ht 61.0 in | Wt 113.6 lb

## 2016-09-03 DIAGNOSIS — I10 Essential (primary) hypertension: Secondary | ICD-10-CM

## 2016-09-03 MED ORDER — VALSARTAN 160 MG PO TABS: 160.0000 mg | ORAL_TABLET | Freq: Every day | ORAL | 1 refills | Status: DC

## 2016-09-03 NOTE — Progress Notes (Signed)
Subjective:    Patient ID: Sylvia Villarreal, female    DOB: February 18, 1925, 83 y.o.   MRN: [id number]  HPI 83 year old female who presents to the office today for six month follow up of hypertension and for medication refills.   Her daughter is with her at this visit. She reports that over all her mother is doing well but has noticed some increased cognitive decline over the past month. Denies any wondering behavior, falls, or agitation.   BP Readings from Last 3 Encounters:  09/03/16 122/80  10/04/15 (!) 150/80  09/21/14 140/68    Review of Systems See HPI   Past Medical History:  Diagnosis Date  . Allergic rhinitis, cause unspecified   . Breast cancer ([place])   . Cramp of limb   . Osteoporosis, unspecified   . Other and unspecified hyperlipidemia   . Personal history of malignant neoplasm of breast   . Senile dementia   . Unspecified closed fracture of carpal bone   . Unspecified essential hypertension     Social History   Social History  . Marital status: Married    Spouse name: N/A  . Number of children: N/A  . Years of education: N/A   Occupational History  . Not on file.   Social History Main Topics  . Smoking status: Never Smoker  . Smokeless tobacco: Never Used  . Alcohol use No  . Drug use: No  . Sexual activity: Not on file   Other Topics Concern  . 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 2 daughters; 5 grandchildren;several great grandchildren. Lives alone and remains independent.  full life alert system in the home. She has bought her burial plot.  ACP - full code and support. [place] (c) [phone]; Nobie Putnam. [place] ([phone] Brent [phone]          Past Surgical History:  Procedure Laterality Date  . ABDOMINAL HYSTERECTOMY    . APPENDECTOMY    . MODIFIED RADICAL MASTECTOMY W/ AXILLARY LYMPH NODE DISSECTION    . OOPHORECTOMY       Family History  Problem Relation Age of Onset  . Alzheimer's disease Mother   . Stroke Father   . Hypertension Father   . Colon cancer Neg Hx   . Breast cancer Neg Hx   . Diabetes Neg Hx     Allergies  Allergen Reactions  . Sulfonamide Derivatives Other (See Comments)    Unknown    Current Outpatient Prescriptions on File Prior to Visit  Medication Sig Dispense Refill  . Ibuprofen (ADVIL PO) Take by mouth every other day.     No current facility-administered medications on file prior to visit.     BP 122/80   Pulse 90   Temp 98.7 F (37.1 C) (Oral)   Ht 5\' 1"  (1.549 m)   Wt 113 lb 9.6 oz (51.5 kg)   BMI 21.46 kg/m       Objective:   Physical Exam  Constitutional: She appears well-developed and well-nourished. No distress.  Cardiovascular: Normal rate, regular rhythm, normal heart sounds and intact distal pulses.  Exam reveals no gallop and no friction rub.   No murmur heard. Pulmonary/Chest: Effort normal and breath sounds normal. No respiratory distress. She has no wheezes. She has no rales. She exhibits no tenderness.  Neurological: She is alert.  Skin: Skin is  warm and dry. She is not diaphoretic.  Psychiatric: She has a normal mood and affect. Her speech is normal and behavior is normal. Judgment and thought content normal. Cognition and memory are impaired.  Nursing note and vitals reviewed.     Assessment & Plan:  1. Essential hypertension - Controlled.  - Follow up in 6 months or sooner if needed - valsartan (DIOVAN) 160 MG tablet; Take 1 tablet (160 mg total) by mouth daily.  Dispense: 90 tablet; Refill: 1  Dorothyann Peng, NP

## 2016-09-08 ENCOUNTER — Telehealth: Payer: Self-pay | Admitting: Adult Health

## 2016-09-08 ENCOUNTER — Ambulatory Visit: Payer: Medicare Other | Admitting: Adult Health

## 2016-09-08 MED ORDER — LOSARTAN POTASSIUM 100 MG PO TABS: 100.0000 mg | ORAL_TABLET | Freq: Every day | ORAL | 3 refills | Status: DC

## 2016-09-08 NOTE — Telephone Encounter (Signed)
Please call in a new prescription for losartan 100 mg #90 one daily

## 2016-09-08 NOTE — Telephone Encounter (Signed)
Spoke with patients daughter and informed her to d/c Valstartan and start losartan 100 mg #90 one daily, which was called in to [facility].

## 2016-09-08 NOTE — Telephone Encounter (Signed)
The patient went to [facility] to pick up her Rx and they said that there is a recall on this Rx and needs another Rx sent to the pharmacy   valsartan (DIOVAN) 160 MG tablet

## 2016-09-08 NOTE — Telephone Encounter (Signed)
See message below, please advise.

## 2017-01-28 ENCOUNTER — Other Ambulatory Visit: Payer: Self-pay

## 2017-02-12 ENCOUNTER — Telehealth: Payer: Self-pay | Admitting: Adult Health

## 2017-02-12 NOTE — Telephone Encounter (Signed)
Jenell Milliner dropped off Adult Day Care forms for Sylvia Villarreal  Fax to: [phone] ATTN: Simmie Davies  Call Jenell Milliner to pick up forms after faxed at: [phone] or [phone]  Disposition: Dr's Folder

## 2017-02-17 NOTE — Telephone Encounter (Signed)
Called and spoke to [place].  Informed her that form has been faxed.  Received confirmation the fax was successful.  Original placed at the front desk for pick up.  No further action needed.  Will now close note.

## 2017-02-17 NOTE — Telephone Encounter (Signed)
Form received.  Filled out and gave to [facility] to finish.

## 2017-02-25 ENCOUNTER — Telehealth: Payer: Self-pay | Admitting: Adult Health

## 2017-02-25 NOTE — Telephone Encounter (Signed)
Sylvia Villarreal picked up the form  Paid $20 form fee

## 2017-03-19 ENCOUNTER — Encounter: Payer: Self-pay | Admitting: Adult Health

## 2017-03-19 ENCOUNTER — Ambulatory Visit (INDEPENDENT_AMBULATORY_CARE_PROVIDER_SITE_OTHER): Payer: Medicare HMO | Admitting: Adult Health

## 2017-03-19 VITALS — BP 142/72 | Temp 98.9°F | Wt 127.0 lb

## 2017-03-19 DIAGNOSIS — Z7189 Other specified counseling: Secondary | ICD-10-CM

## 2017-03-19 NOTE — Progress Notes (Signed)
Subjective:    Patient ID: Sylvia Villarreal, female    DOB: 06/20/1924, 84 y.o.   MRN: [id number]  HPI 84 year old female who  has a past medical history of Allergic rhinitis, cause unspecified, Breast cancer ([place]), Cramp of limb, Osteoporosis, unspecified, Other and unspecified hyperlipidemia, Personal history of malignant neoplasm of breast, Senile dementia, Unspecified closed fracture of carpal bone, and Unspecified essential hypertension.   She presents with her daughter to discuss DNR forms and MOS form. She would like to be a DNR.   Review of Systems See HPI   Past Medical History:  Diagnosis Date  . Allergic rhinitis, cause unspecified   . Breast cancer ([place])   . Cramp of limb   . Osteoporosis, unspecified   . Other and unspecified hyperlipidemia   . Personal history of malignant neoplasm of breast   . Senile dementia   . Unspecified closed fracture of carpal bone   . Unspecified essential hypertension     Social History   Socioeconomic History  . Marital status: Married    Spouse name: Not on file  . Number of children: Not on file  . Years of education: Not on file  . Highest education level: Not on file  Social Needs  . Financial resource strain: Not on file  . Food insecurity - worry: Not on file  . Food insecurity - inability: Not on file  . Transportation needs - medical: Not on file  . Transportation needs - non-medical: Not on file  Occupational History  . Not on file  Tobacco Use  . Smoking status: Never Smoker  . Smokeless tobacco: Never Used  Substance and Sexual Activity  . Alcohol use: No  . Drug use: No  . Sexual activity: Not on file  Other Topics Concern  . 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 2 daughters; 5 grandchildren;several great grandchildren. Lives alone and remains independent.  full life alert system in the home. She has bought her burial plot.  ACP - full code  and support. [place] (c) [phone]; Nobie Putnam. [place] ([phone] Brent [phone]       Past Surgical History:  Procedure Laterality Date  . ABDOMINAL HYSTERECTOMY    . APPENDECTOMY    . MODIFIED RADICAL MASTECTOMY W/ AXILLARY LYMPH NODE DISSECTION    . OOPHORECTOMY      Family History  Problem Relation Age of Onset  . Alzheimer's disease Mother   . Stroke Father   . Hypertension Father   . Colon cancer Neg Hx   . Breast cancer Neg Hx   . Diabetes Neg Hx     Allergies  Allergen Reactions  . Sulfonamide Derivatives Other (See Comments)    Unknown    Current Outpatient Medications on File Prior to Visit  Medication Sig Dispense Refill  . Ibuprofen (ADVIL PO) Take by mouth every other day.    . losartan (COZAAR) 100 MG tablet Take 1 tablet (100 mg total) by mouth daily. 90 tablet 3   No current facility-administered medications on file prior to visit.     BP (!) 142/72 (BP Location: Left Arm)   Temp 98.9 F (37.2 C) (Oral)   Wt 127 lb (57.6 kg)   BMI 24.00 kg/m       Objective:   Physical Exam  Constitutional: She is oriented to person, place, and  time. She appears well-developed and well-nourished.  Neurological: She is alert and oriented to person, place, and time.  Skin: Skin is warm and dry. No rash noted. No erythema. No pallor.  Psychiatric: She has a normal mood and affect. Her behavior is normal. Judgment and thought content normal.  Nursing note and vitals reviewed.      Assessment & Plan:  1. Advanced directives, counseling/discussion - Reviewed DNR and MOST form with patient and her daughter. Both were of sound mind. Patient requests to be DNR. We also reviewed MOST form and filled this out.  Originals were given to family as well as copied.  - DNR (Do Not Resuscitate) - Copies were scanned into chart   Dorothyann Peng, NP

## 2017-03-25 ENCOUNTER — Telehealth: Payer: Self-pay | Admitting: Family Medicine

## 2017-03-25 NOTE — Telephone Encounter (Signed)
Copied from [place]. Topic: General - Other >> Mar 25, 2017  3:27 PM [place], Laban Emperor wrote: Reason for CRM: Pt daugther Hassan Rowan states pt goes to an adult day center and they will not take a copy of the DNR. They will have to have the organial one.  Please contact Hassan Rowan when form is ready for pick up.

## 2017-03-26 NOTE — Telephone Encounter (Signed)
Returned call to [place] and notified her to take the original yellow DNR to the daycare. She is not sure if they will need another original DNR to take to adult day care, but she will take the one she has for now and will contact the office if she needs another DNR form.

## 2017-03-31 ENCOUNTER — Ambulatory Visit: Payer: Self-pay

## 2017-03-31 ENCOUNTER — Telehealth: Payer: Self-pay | Admitting: Family Medicine

## 2017-03-31 ENCOUNTER — Ambulatory Visit (INDEPENDENT_AMBULATORY_CARE_PROVIDER_SITE_OTHER): Payer: Medicare HMO | Admitting: Family Medicine

## 2017-03-31 ENCOUNTER — Encounter: Payer: Self-pay | Admitting: Family Medicine

## 2017-03-31 VITALS — BP 180/90 | HR 102 | Temp 100.2°F

## 2017-03-31 DIAGNOSIS — R6889 Other general symptoms and signs: Secondary | ICD-10-CM | POA: Diagnosis not present

## 2017-03-31 DIAGNOSIS — J101 Influenza due to other identified influenza virus with other respiratory manifestations: Secondary | ICD-10-CM | POA: Diagnosis not present

## 2017-03-31 LAB — POC INFLUENZA A&B (BINAX/QUICKVUE)
Influenza A, POC: POSITIVE — AB
Influenza B, POC: NEGATIVE

## 2017-03-31 MED ORDER — OSELTAMIVIR PHOSPHATE 75 MG PO CAPS: 75.0000 mg | ORAL_CAPSULE | Freq: Two times a day (BID) | ORAL | 0 refills | Status: DC

## 2017-03-31 NOTE — Progress Notes (Signed)
   Subjective:    Patient ID: Sylvia Villarreal, female    DOB: Apr 02, 1924, 84 y.o.   MRN: [id number]  HPI Here with her daughter for the onset last night of fever to 100 degrees, a dry cough, weakness, and confusion. No vomiting or diarrhea. She is sipping fluids.    Review of Systems  Constitutional: Positive for fever.  HENT: Negative.   Respiratory: Positive for cough. Negative for wheezing.   Cardiovascular: Negative.   Gastrointestinal: Negative.   Psychiatric/Behavioral: Positive for confusion.       Objective:   Physical Exam  Constitutional:  Alert but weak, in a wheelchair   HENT:  Right Ear: External ear normal.  Left Ear: External ear normal.  Nose: Nose normal.  Mouth/Throat: Oropharynx is clear and moist.  Eyes: Conjunctivae are normal.  Neck: No thyromegaly present.  Cardiovascular: Normal rate, regular rhythm, normal heart sounds and intact distal pulses.  Pulmonary/Chest: Effort normal and breath sounds normal. No respiratory distress. She has no wheezes. She has no rales.  Abdominal: Soft. Bowel sounds are normal. She exhibits no distension and no mass. There is no tenderness. There is no rebound and no guarding.  Lymphadenopathy:    She has no cervical adenopathy.          Assessment & Plan:  Influenza A. Treat with Tamiflu for 5 days. Encourage oral fluids.  Alysia Penna, MD

## 2017-03-31 NOTE — Telephone Encounter (Signed)
Copied from [place]. Topic: Quick Communication - Rx Refill/Question >> Mar 31, 2017 12:15 PM Marin Olp L wrote: Medication: tamiflu Has the patient contacted their pharmacy? Yes.   (Agent: If no, request that the patient contact the pharmacy for the refill.) Preferred Pharmacy (with phone number or street name): [facility] [address] Agent: Please be advised that RX refills may take up to 3 business days. We ask that you follow-up with your pharmacy. Patient script not at pharm It was sent to today so they would like it sent to [facility] on [address] instead.

## 2017-03-31 NOTE — Telephone Encounter (Signed)
  Reason for Disposition . [1] Continuous (nonstop) coughing interferes with work or school AND [2] no improvement using cough treatment per protocol  Answer Assessment - Initial Assessment Questions 1. ONSET: "When did the cough begin?"      Started yesterday 2. SEVERITY: "How bad is the cough today?"      Moderate 3. RESPIRATORY DISTRESS: "Describe your breathing."      No distress 4. FEVER: "Do you have a fever?" If so, ask: "What is your temperature, how was it measured, and when did it start?"     Unsure 5. HEMOPTYSIS: "Are you coughing up any blood?" If so ask: "How much?" (flecks, streaks, tablespoons, etc.)     No 6. TREATMENT: "What have you done so far to treat the cough?" (e.g., meds, fluids, humidifier)     No treatment 7. CARDIAC HISTORY: "Do you have any history of heart disease?" (e.g., heart attack, congestive heart failure)      HTN 8. LUNG HISTORY: "Do you have any history of lung disease?"  (e.g., pulmonary embolus, asthma, emphysema)     No 9. PE RISK FACTORS: "Do you have a history of blood clots?" (or: recent major surgery, recent prolonged travel, bedridden )     No 10. OTHER SYMPTOMS: "Do you have any other symptoms? (e.g., runny nose, wheezing, chest pain)       Runny nose, disoriented,weak 11. PREGNANCY: "Is there any chance you are pregnant?" "When was your last menstrual period?"       No 12. TRAVEL: "Have you traveled out of the country in the last month?" (e.g., travel history, exposures)       No  Protocols used: COUGH - ACUTE NON-PRODUCTIVE-A-AH Appointment made for today.

## 2017-03-31 NOTE — Telephone Encounter (Signed)
I spoke with Sylvia Villarreal and he has picked up script for patient.

## 2017-04-02 NOTE — Telephone Encounter (Signed)
error 

## 2017-05-24 ENCOUNTER — Telehealth: Payer: Self-pay | Admitting: Adult Health

## 2017-05-24 NOTE — Telephone Encounter (Signed)
Pts daughter Jenell Milliner) dropped off a FL-2 form to be filled out and would like to be called at [phone] when it is completed.  Form was put in the doctors folder.

## 2017-05-25 ENCOUNTER — Ambulatory Visit (INDEPENDENT_AMBULATORY_CARE_PROVIDER_SITE_OTHER): Payer: Medicare HMO

## 2017-05-25 DIAGNOSIS — Z111 Encounter for screening for respiratory tuberculosis: Secondary | ICD-10-CM | POA: Diagnosis not present

## 2017-05-25 NOTE — Telephone Encounter (Signed)
Left a message for a return call.  Have questions about FL2 form.

## 2017-05-25 NOTE — Progress Notes (Addendum)
Per orders of Cory, injection of TUBERCULIN 0.59mL was given ( left Arm) by Wyvonne Lenz. Pt was Advised to return in 48-72 hrs for the reading of the TB Test. Patient tolerated injection well.

## 2017-05-25 NOTE — Telephone Encounter (Signed)
Sylvia Villarreal notified to pick up form at the front desk.

## 2017-05-26 NOTE — Telephone Encounter (Signed)
Hassan Rowan picked up form  No charge

## 2017-05-27 LAB — TB SKIN TEST
Induration: 0 mm
TB Skin Test: NEGATIVE

## 2017-07-13 ENCOUNTER — Ambulatory Visit (INDEPENDENT_AMBULATORY_CARE_PROVIDER_SITE_OTHER): Payer: Medicare HMO | Admitting: Adult Health

## 2017-07-13 ENCOUNTER — Encounter: Payer: Self-pay | Admitting: Adult Health

## 2017-07-13 VITALS — BP 180/80 | Temp 98.9°F | Wt 127.0 lb

## 2017-07-13 DIAGNOSIS — M79604 Pain in right leg: Secondary | ICD-10-CM | POA: Diagnosis not present

## 2017-07-13 DIAGNOSIS — R2241 Localized swelling, mass and lump, right lower limb: Secondary | ICD-10-CM | POA: Diagnosis not present

## 2017-07-13 DIAGNOSIS — I10 Essential (primary) hypertension: Secondary | ICD-10-CM | POA: Diagnosis not present

## 2017-07-13 DIAGNOSIS — M7989 Other specified soft tissue disorders: Secondary | ICD-10-CM

## 2017-07-13 DIAGNOSIS — M79661 Pain in right lower leg: Secondary | ICD-10-CM | POA: Diagnosis not present

## 2017-07-13 NOTE — Progress Notes (Signed)
Subjective:    Patient ID: Sylvia Villarreal, female    DOB: February 14, 1925, 84 y.o.   MRN: [id number]  HPI  84 year old female who  has a past medical history of Allergic rhinitis, cause unspecified, Breast cancer ([place]), Cramp of limb, Osteoporosis, unspecified, Other and unspecified hyperlipidemia, Personal history of malignant neoplasm of breast, Senile dementia, Unspecified closed fracture of carpal bone, and Unspecified essential hypertension.  She presents with her daughter to this visit for an acute issue of lower extremity swelling. First noticed about two weeks ago but daughter feels as though swelling has become worse. She brought her in today because she complained of pain with palpation last night.  She has been sleeping with her legs on the floor and will not elevated her legs when resting.   Review of Systems See HPI   Past Medical History:  Diagnosis Date  . Allergic rhinitis, cause unspecified   . Breast cancer ([place])   . Cramp of limb   . Osteoporosis, unspecified   . Other and unspecified hyperlipidemia   . Personal history of malignant neoplasm of breast   . Senile dementia   . Unspecified closed fracture of carpal bone   . Unspecified essential hypertension     Social History   Socioeconomic History  . Marital status: Married    Spouse name: Not on file  . Number of children: Not on file  . Years of education: Not on file  . Highest education level: Not on file  Occupational History  . Not on file  Social Needs  . Financial resource strain: Not on file  . Food insecurity:    Worry: Not on file    Inability: Not on file  . Transportation needs:    Medical: Not on file    Non-medical: Not on file  Tobacco Use  . Smoking status: Never Smoker  . Smokeless tobacco: Never Used  Substance and Sexual Activity  . Alcohol use: No  . Drug use: No  . Sexual activity: Not on file  Lifestyle  . Physical activity:    Days per week: Not on file    Minutes per  session: Not on file  . Stress: Not on file  Relationships  . Social connections:    Talks on phone: Not on file    Gets together: Not on file    Attends religious service: Not on file    Active member of club or organization: Not on file    Attends meetings of clubs or organizations: Not on file    Relationship status: Not on file  . Intimate partner violence:    Fear of current or ex partner: Not on file    Emotionally abused: Not on file    Physically abused: Not on file    Forced sexual activity: Not on file  Other Topics Concern  . 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 2 daughters; 5 grandchildren;several great grandchildren. Lives alone and remains independent.  full life alert system in the home. She has bought her burial plot.  ACP - full code and support. [place] (c) [phone]; Nobie Putnam. [place] ([phone] Brent [phone]       Past Surgical History:  Procedure Laterality Date  . ABDOMINAL HYSTERECTOMY    . APPENDECTOMY    . MODIFIED RADICAL MASTECTOMY W/ AXILLARY LYMPH NODE DISSECTION    .  OOPHORECTOMY      Family History  Problem Relation Age of Onset  . Alzheimer's disease Mother   . Stroke Father   . Hypertension Father   . Colon cancer Neg Hx   . Breast cancer Neg Hx   . Diabetes Neg Hx     Allergies  Allergen Reactions  . Sulfonamide Derivatives Other (See Comments)    Unknown    Current Outpatient Medications on File Prior to Visit  Medication Sig Dispense Refill  . Ibuprofen (ADVIL PO) Take by mouth every other day.    . losartan (COZAAR) 100 MG tablet Take 1 tablet (100 mg total) by mouth daily. 90 tablet 3   No current facility-administered medications on file prior to visit.     BP (!) 180/80   Temp 98.9 F (37.2 C) (Oral)   Wt 127 lb (57.6 kg)   BMI 24.00 kg/m       Objective:   Physical Exam  Constitutional: She is  oriented to person, place, and time. She appears well-developed and well-nourished. No distress.  Cardiovascular: Normal rate, regular rhythm, normal heart sounds and intact distal pulses. Exam reveals no gallop and no friction rub.  No murmur heard. Pulmonary/Chest: Effort normal and breath sounds normal.  Musculoskeletal: She exhibits edema and tenderness.  +2 edema noted to right ankle and calf. Pain with palpation to right calf. No redness or warmth   Right calf 11.5 inch circumference  Left Calf 11.5 inch circumference   Right ankle 9 inch circumference  Left ankle 8 inch circumference   Neurological: She is alert and oriented to person, place, and time.  Skin: Skin is warm and dry. Capillary refill takes less than 2 seconds. She is not diaphoretic.  Psychiatric: She has a normal mood and affect. Her behavior is normal. Judgment and thought content normal.  Nursing note and vitals reviewed.     Assessment & Plan:  1. Swelling of right lower extremity - Need to r/o  Blood clot. Being that it is so late in the day, I recommend local ER. Family is in agreement.   2. Essential hypertension - Will likely add medication to help control BP  - Will follow up tomorrow after ER visit   Dorothyann Peng, NP

## 2017-07-14 ENCOUNTER — Telehealth: Payer: Self-pay | Admitting: Adult Health

## 2017-07-14 MED ORDER — AMLODIPINE BESYLATE 2.5 MG PO TABS: 2.5000 mg | ORAL_TABLET | Freq: Every day | ORAL | 0 refills | Status: DC

## 2017-07-14 NOTE — Telephone Encounter (Signed)
Spoke to patients daughter about ER visit from yesterday. No DVT noted, BMP and CBC were normal. Encouraged daughter to have patient elevate legs.   Will add Norvasc 2.5 mg daily for BP control and follow up next week

## 2017-09-16 ENCOUNTER — Other Ambulatory Visit: Payer: Self-pay | Admitting: Family Medicine

## 2017-09-19 MED ORDER — LOSARTAN POTASSIUM 100 MG PO TABS: 100.0000 mg | ORAL_TABLET | Freq: Every day | ORAL | 3 refills | Status: DC

## 2017-10-31 ENCOUNTER — Encounter (HOSPITAL_COMMUNITY): Payer: Self-pay

## 2017-10-31 ENCOUNTER — Other Ambulatory Visit: Payer: Self-pay

## 2017-10-31 ENCOUNTER — Emergency Department (HOSPITAL_COMMUNITY): Payer: Medicare HMO

## 2017-10-31 ENCOUNTER — Inpatient Hospital Stay (HOSPITAL_COMMUNITY)
Admission: EM | Admit: 2017-10-31 | Discharge: 2017-11-08 | DRG: 480 | Disposition: A | Discharge status: Skilled Nursing Facility | Payer: Medicare HMO | Attending: Internal Medicine | Admitting: Internal Medicine

## 2017-10-31 DIAGNOSIS — R5082 Postprocedural fever: Secondary | ICD-10-CM | POA: Diagnosis not present

## 2017-10-31 DIAGNOSIS — R402142 Coma scale, eyes open, spontaneous, at arrival to emergency department: Secondary | ICD-10-CM | POA: Diagnosis present

## 2017-10-31 DIAGNOSIS — Z66 Do not resuscitate: Secondary | ICD-10-CM | POA: Diagnosis not present

## 2017-10-31 DIAGNOSIS — N179 Acute kidney failure, unspecified: Secondary | ICD-10-CM | POA: Diagnosis not present

## 2017-10-31 DIAGNOSIS — S72141A Displaced intertrochanteric fracture of right femur, initial encounter for closed fracture: Principal | ICD-10-CM | POA: Diagnosis present

## 2017-10-31 DIAGNOSIS — Z9181 History of falling: Secondary | ICD-10-CM | POA: Diagnosis not present

## 2017-10-31 DIAGNOSIS — D72829 Elevated white blood cell count, unspecified: Secondary | ICD-10-CM | POA: Diagnosis present

## 2017-10-31 DIAGNOSIS — Z820 Family history of epilepsy and other diseases of the nervous system: Secondary | ICD-10-CM

## 2017-10-31 DIAGNOSIS — Z791 Long term (current) use of non-steroidal anti-inflammatories (NSAID): Secondary | ICD-10-CM | POA: Diagnosis not present

## 2017-10-31 DIAGNOSIS — F0390 Unspecified dementia without behavioral disturbance: Secondary | ICD-10-CM | POA: Diagnosis present

## 2017-10-31 DIAGNOSIS — D62 Acute posthemorrhagic anemia: Secondary | ICD-10-CM | POA: Diagnosis not present

## 2017-10-31 DIAGNOSIS — I129 Hypertensive chronic kidney disease with stage 1 through stage 4 chronic kidney disease, or unspecified chronic kidney disease: Secondary | ICD-10-CM | POA: Diagnosis not present

## 2017-10-31 DIAGNOSIS — M810 Age-related osteoporosis without current pathological fracture: Secondary | ICD-10-CM | POA: Diagnosis present

## 2017-10-31 DIAGNOSIS — Y92009 Unspecified place in unspecified non-institutional (private) residence as the place of occurrence of the external cause: Secondary | ICD-10-CM | POA: Diagnosis not present

## 2017-10-31 DIAGNOSIS — R0902 Hypoxemia: Secondary | ICD-10-CM | POA: Diagnosis not present

## 2017-10-31 DIAGNOSIS — Z90710 Acquired absence of both cervix and uterus: Secondary | ICD-10-CM

## 2017-10-31 DIAGNOSIS — I6330 Cerebral infarction due to thrombosis of unspecified cerebral artery: Secondary | ICD-10-CM | POA: Diagnosis not present

## 2017-10-31 DIAGNOSIS — Z882 Allergy status to sulfonamides status: Secondary | ICD-10-CM | POA: Diagnosis not present

## 2017-10-31 DIAGNOSIS — W19XXXA Unspecified fall, initial encounter: Secondary | ICD-10-CM | POA: Diagnosis not present

## 2017-10-31 DIAGNOSIS — R2689 Other abnormalities of gait and mobility: Secondary | ICD-10-CM | POA: Diagnosis not present

## 2017-10-31 DIAGNOSIS — R402212 Coma scale, best verbal response, none, at arrival to emergency department: Secondary | ICD-10-CM | POA: Diagnosis present

## 2017-10-31 DIAGNOSIS — R4182 Altered mental status, unspecified: Secondary | ICD-10-CM | POA: Diagnosis not present

## 2017-10-31 DIAGNOSIS — D631 Anemia in chronic kidney disease: Secondary | ICD-10-CM | POA: Diagnosis present

## 2017-10-31 DIAGNOSIS — R231 Pallor: Secondary | ICD-10-CM | POA: Diagnosis not present

## 2017-10-31 DIAGNOSIS — Z8249 Family history of ischemic heart disease and other diseases of the circulatory system: Secondary | ICD-10-CM | POA: Diagnosis not present

## 2017-10-31 DIAGNOSIS — S72141D Displaced intertrochanteric fracture of right femur, subsequent encounter for closed fracture with routine healing: Secondary | ICD-10-CM | POA: Diagnosis not present

## 2017-10-31 DIAGNOSIS — Z4789 Encounter for other orthopedic aftercare: Secondary | ICD-10-CM | POA: Diagnosis not present

## 2017-10-31 DIAGNOSIS — R402362 Coma scale, best motor response, obeys commands, at arrival to emergency department: Secondary | ICD-10-CM | POA: Diagnosis present

## 2017-10-31 DIAGNOSIS — S72001A Fracture of unspecified part of neck of right femur, initial encounter for closed fracture: Secondary | ICD-10-CM | POA: Diagnosis present

## 2017-10-31 DIAGNOSIS — R279 Unspecified lack of coordination: Secondary | ICD-10-CM | POA: Diagnosis not present

## 2017-10-31 DIAGNOSIS — S72001D Fracture of unspecified part of neck of right femur, subsequent encounter for closed fracture with routine healing: Secondary | ICD-10-CM | POA: Diagnosis not present

## 2017-10-31 DIAGNOSIS — Z823 Family history of stroke: Secondary | ICD-10-CM | POA: Diagnosis not present

## 2017-10-31 DIAGNOSIS — M25551 Pain in right hip: Secondary | ICD-10-CM | POA: Diagnosis not present

## 2017-10-31 DIAGNOSIS — Z09 Encounter for follow-up examination after completed treatment for conditions other than malignant neoplasm: Secondary | ICD-10-CM

## 2017-10-31 DIAGNOSIS — Z79899 Other long term (current) drug therapy: Secondary | ICD-10-CM

## 2017-10-31 DIAGNOSIS — R29898 Other symptoms and signs involving the musculoskeletal system: Secondary | ICD-10-CM | POA: Diagnosis not present

## 2017-10-31 DIAGNOSIS — R1319 Other dysphagia: Secondary | ICD-10-CM | POA: Diagnosis not present

## 2017-10-31 DIAGNOSIS — M6281 Muscle weakness (generalized): Secondary | ICD-10-CM | POA: Diagnosis not present

## 2017-10-31 DIAGNOSIS — R69 Illness, unspecified: Secondary | ICD-10-CM | POA: Diagnosis not present

## 2017-10-31 DIAGNOSIS — I10 Essential (primary) hypertension: Secondary | ICD-10-CM | POA: Diagnosis present

## 2017-10-31 DIAGNOSIS — F22 Delusional disorders: Secondary | ICD-10-CM

## 2017-10-31 DIAGNOSIS — R531 Weakness: Secondary | ICD-10-CM | POA: Diagnosis not present

## 2017-10-31 DIAGNOSIS — W010XXA Fall on same level from slipping, tripping and stumbling without subsequent striking against object, initial encounter: Secondary | ICD-10-CM | POA: Diagnosis not present

## 2017-10-31 DIAGNOSIS — F0392 Unspecified dementia, unspecified severity, with psychotic disturbance: Secondary | ICD-10-CM | POA: Diagnosis present

## 2017-10-31 DIAGNOSIS — N183 Chronic kidney disease, stage 3 (moderate): Secondary | ICD-10-CM | POA: Diagnosis not present

## 2017-10-31 DIAGNOSIS — Z419 Encounter for procedure for purposes other than remedying health state, unspecified: Secondary | ICD-10-CM

## 2017-10-31 DIAGNOSIS — E7849 Other hyperlipidemia: Secondary | ICD-10-CM | POA: Diagnosis present

## 2017-10-31 DIAGNOSIS — E860 Dehydration: Secondary | ICD-10-CM | POA: Diagnosis present

## 2017-10-31 DIAGNOSIS — Z743 Need for continuous supervision: Secondary | ICD-10-CM | POA: Diagnosis not present

## 2017-10-31 DIAGNOSIS — J9811 Atelectasis: Secondary | ICD-10-CM | POA: Diagnosis not present

## 2017-10-31 DIAGNOSIS — Z853 Personal history of malignant neoplasm of breast: Secondary | ICD-10-CM | POA: Diagnosis not present

## 2017-10-31 DIAGNOSIS — R52 Pain, unspecified: Secondary | ICD-10-CM | POA: Diagnosis not present

## 2017-10-31 DIAGNOSIS — R509 Fever, unspecified: Secondary | ICD-10-CM

## 2017-10-31 DIAGNOSIS — R41841 Cognitive communication deficit: Secondary | ICD-10-CM | POA: Diagnosis not present

## 2017-10-31 LAB — CBC WITH DIFFERENTIAL/PLATELET
Basophils Absolute: 0 10*3/uL (ref 0.0–0.1)
Basophils Relative: 0 %
EOS ABS: 0 10*3/uL (ref 0.0–0.7)
Eosinophils Relative: 0 %
HEMATOCRIT: 39.1 % (ref 36.0–46.0)
HEMOGLOBIN: 13.2 g/dL (ref 12.0–15.0)
LYMPHS ABS: 1.2 10*3/uL (ref 0.7–4.0)
LYMPHS PCT: 8 %
MCH: 31.9 pg (ref 26.0–34.0)
MCHC: 33.8 g/dL (ref 30.0–36.0)
MCV: 94.4 fL (ref 78.0–100.0)
MONOS PCT: 4 %
Monocytes Absolute: 0.6 10*3/uL (ref 0.1–1.0)
NEUTROS ABS: 12.7 10*3/uL — AB (ref 1.7–7.7)
NEUTROS PCT: 88 %
Platelets: 264 10*3/uL (ref 150–400)
RBC: 4.14 MIL/uL (ref 3.87–5.11)
RDW: 12.4 % (ref 11.5–15.5)
WBC: 14.4 10*3/uL — ABNORMAL HIGH (ref 4.0–10.5)

## 2017-10-31 LAB — BASIC METABOLIC PANEL
Anion gap: 11 (ref 5–15)
BUN: 22 mg/dL (ref 8–23)
CALCIUM: 8.8 mg/dL — AB (ref 8.9–10.3)
CO2: 26 mmol/L (ref 22–32)
CREATININE: 1.3 mg/dL — AB (ref 0.44–1.00)
Chloride: 105 mmol/L (ref 98–111)
GFR calc Af Amer: 40 mL/min — ABNORMAL LOW (ref >60)
GFR calc non Af Amer: 35 mL/min — ABNORMAL LOW (ref >60)
Glucose, Bld: 156 mg/dL — ABNORMAL HIGH (ref 70–99)
Potassium: 3.7 mmol/L (ref 3.5–5.1)
Sodium: 142 mmol/L (ref 135–145)

## 2017-10-31 LAB — TYPE AND SCREEN
ABO/RH(D): A POS
Antibody Screen: NEGATIVE

## 2017-10-31 LAB — PROTIME-INR
INR: 1
Prothrombin Time: 13.1 seconds (ref 11.4–15.2)

## 2017-10-31 LAB — APTT: aPTT: 20 seconds — ABNORMAL LOW (ref 24–36)

## 2017-10-31 MED ORDER — ONDANSETRON HCL 4 MG PO TABS: 4.0000 mg | ORAL_TABLET | Freq: Four times a day (QID) | ORAL | Status: DC | PRN

## 2017-10-31 MED ORDER — ONDANSETRON HCL 4 MG/2ML IJ SOLN
4.0000 mg | Freq: Four times a day (QID) | INTRAMUSCULAR | Status: DC | PRN
  Administered 2017-11-01: 4 mg via INTRAVENOUS

## 2017-10-31 MED ORDER — ACETAMINOPHEN 325 MG PO TABS: 650.0000 mg | ORAL_TABLET | Freq: Four times a day (QID) | ORAL | Status: DC | PRN

## 2017-10-31 MED ORDER — ACETAMINOPHEN 650 MG RE SUPP
650.0000 mg | Freq: Four times a day (QID) | RECTAL | Status: DC | PRN
  Administered 2017-11-04: 650 mg via RECTAL
  Filled 2017-10-31: qty 1

## 2017-10-31 MED ORDER — ONDANSETRON HCL 4 MG/2ML IJ SOLN
4.0000 mg | Freq: Once | INTRAMUSCULAR | Status: AC
  Administered 2017-10-31: 4 mg via INTRAVENOUS
  Filled 2017-10-31: qty 2

## 2017-10-31 MED ORDER — MORPHINE SULFATE (PF) 2 MG/ML IV SOLN
1.0000 mg | INTRAVENOUS | Status: DC | PRN
  Administered 2017-10-31 – 2017-11-01 (×3): 1 mg via INTRAVENOUS
  Filled 2017-10-31 (×3): qty 1

## 2017-10-31 MED ORDER — HYDROCODONE-ACETAMINOPHEN 5-325 MG PO TABS
1.0000 | ORAL_TABLET | ORAL | Status: DC | PRN
  Administered 2017-10-31 – 2017-11-03 (×3): 1 via ORAL
  Administered 2017-11-03: 2 via ORAL
  Administered 2017-11-08: 1 via ORAL
  Filled 2017-10-31 (×4): qty 1
  Filled 2017-10-31: qty 2

## 2017-10-31 MED ORDER — HYDRALAZINE HCL 20 MG/ML IJ SOLN
10.0000 mg | Freq: Three times a day (TID) | INTRAMUSCULAR | Status: DC | PRN
  Administered 2017-10-31 – 2017-11-01 (×2): 10 mg via INTRAVENOUS
  Filled 2017-10-31 (×2): qty 1

## 2017-10-31 MED ORDER — SENNOSIDES-DOCUSATE SODIUM 8.6-50 MG PO TABS: 1.0000 | ORAL_TABLET | Freq: Every evening | ORAL | Status: DC | PRN

## 2017-10-31 NOTE — ED Notes (Signed)
ED TO INPATIENT HANDOFF REPORT  Name/Age/Gender Sylvia Villarreal 84 y.o. female  Code Status Code Status History    Date Active Date Inactive Code Status Order ID Comments User Context   03/19/2017 1049 10/31/2017 1416 DNR [id number]  Dorothyann Peng, NP Outpatient   01/20/2014 1745 01/23/2014 2130 Full Code [id number]  Phillips Climes, MD Inpatient    Questions for Most Recent Historical Code Status (Order [id number])    Question Answer Comment   In the event of cardiac or respiratory ARREST Do not call a "code blue"    In the event of cardiac or respiratory ARREST Do not perform Intubation, CPR, defibrillation or ACLS    In the event of cardiac or respiratory ARREST Use medication by any route, position, wound care, and other measures to relive pain and suffering. May use oxygen, suction and manual treatment of airway obstruction as needed for comfort.         Advance Directive Documentation     Most Recent Value  Type of Advance Directive  Healthcare Power of Attorney  Pre-existing out of facility DNR order (yellow form or pink MOST form)  -  "MOST" Form in Place?  -      Home/SNF/Other Admission to Floor  Chief Complaint fall, rt hip pain  Level of Care/Admitting Diagnosis ED Disposition    ED Disposition Condition [place]: [place] [[id number]]  Level of Care: Med-Surg [16]  Diagnosis: Closed right hip fracture [facility]) [[id number]]  Admitting Physician: Alma Friendly [[id number]]  Attending Physician: Alma Friendly [[id number]]  Estimated length of stay: past midnight tomorrow  Certification:: I certify this patient will need inpatient services for at least 2 midnights  PT Class (Do Not Modify): Inpatient [101]  PT Acc Code (Do Not Modify): Private [1]       Medical History Past Medical History:  Diagnosis Date  . Allergic rhinitis, cause unspecified   . Breast cancer ([place])   . Cramp of limb   . Osteoporosis, unspecified    . Other and unspecified hyperlipidemia   . Personal history of malignant neoplasm of breast   . Senile dementia   . Unspecified closed fracture of carpal bone   . Unspecified essential hypertension     Allergies Allergies  Allergen Reactions  . Sulfonamide Derivatives Other (See Comments)    Unknown    IV Location/Drains/Wounds Patient Lines/Drains/Airways Status   Active Line/Drains/Airways    Name:   Placement date:   Placement time:   Site:   Days:   Peripheral IV 10/31/17 Left Hand   10/31/17    -    Hand   less than 1          Labs/Imaging Results for orders placed or performed during the hospital encounter of 10/31/17 (from the past 48 hour(s))  Protime-INR     Status: None   Collection Time: 10/31/17  4:21 PM  Result Value Ref Range   Prothrombin Time 13.1 11.4 - 15.2 seconds   INR 1.00     Comment: Performed at [facility], [place] [address] [place], [place] [address]  CBC with Differential     Status: Abnormal   Collection Time: 10/31/17  4:21 PM  Result Value Ref Range   WBC 14.4 (H) 4.0 - 10.5 K/uL   RBC 4.14 3.87 - 5.11 MIL/uL   Hemoglobin 13.2 12.0 - 15.0 g/dL   HCT 39.1 36.0 - 46.0 %   MCV 94.4  78.0 - 100.0 fL   MCH 31.9 26.0 - 34.0 pg   MCHC 33.8 30.0 - 36.0 g/dL   RDW 12.4 11.5 - 15.5 %   Platelets 264 150 - 400 K/uL   Neutrophils Relative % 88 %   Neutro Abs 12.7 (H) 1.7 - 7.7 K/uL   Lymphocytes Relative 8 %   Lymphs Abs 1.2 0.7 - 4.0 K/uL   Monocytes Relative 4 %   Monocytes Absolute 0.6 0.1 - 1.0 K/uL   Eosinophils Relative 0 %   Eosinophils Absolute 0.0 0.0 - 0.7 K/uL   Basophils Relative 0 %   Basophils Absolute 0.0 0.0 - 0.1 K/uL    Comment: Performed at [facility], [place] [address] [place], [place] [address]  Basic metabolic panel     Status: Abnormal   Collection Time: 10/31/17  4:21 PM  Result Value Ref Range   Sodium 142 135 - 145 mmol/L   Potassium 3.7 3.5 - 5.1 mmol/L   Chloride 105 98 -  111 mmol/L   CO2 26 22 - 32 mmol/L   Glucose, Bld 156 (H) 70 - 99 mg/dL   BUN 22 8 - 23 mg/dL   Creatinine, Ser 1.30 (H) 0.44 - 1.00 mg/dL   Calcium 8.8 (L) 8.9 - 10.3 mg/dL   GFR calc non Af Amer 35 (L) >60 mL/min   GFR calc Af Amer 40 (L) >60 mL/min    Comment: (NOTE) The eGFR has been calculated using the CKD EPI equation. This calculation has not been validated in all clinical situations. eGFR's persistently <60 mL/min signify possible Chronic Kidney Disease.    Anion gap 11 5 - 15    Comment: Performed at [facility], [place] [address], [place], [place] [address]  APTT     Status: Abnormal   Collection Time: 10/31/17  4:21 PM  Result Value Ref Range   aPTT 20 (L) 24 - 36 seconds    Comment: Performed at [facility], [place] [address], [place], [place] [address]  Type and screen [place]     Status: None (Preliminary result)   Collection Time: 10/31/17  4:21 PM  Result Value Ref Range   ABO/RH(D) PENDING    Antibody Screen PENDING    Sample Expiration      11/03/2017 Performed at [facility], [place] [address], [place], [place] [address]    Dg Chest Port 1 View  Result Date: 10/31/2017 CLINICAL DATA:  Right hip fracture. Preoperative evaluation. EXAM: PORTABLE CHEST 1 VIEW COMPARISON:  None. FINDINGS: Normal sized heart. Tortuous and calcified thoracic aorta. Elevated right hemidiaphragm with minimal linear atelectasis or scarring at the right lung base. Clear left lung. Diffuse osteopenia. IMPRESSION: Elevated right hemidiaphragm with minimal linear atelectasis or scarring at the right lung base. Otherwise, unremarkable examination. Electronically Signed   By: Claudie Revering M.D.   On: 10/31/2017 17:03   Dg Hip Unilat  With Pelvis 2-3 Views Right  Result Date: 10/31/2017 CLINICAL DATA:  Fall at home.  Right hip pain.  Initial encounter. EXAM: DG HIP (WITH OR WITHOUT PELVIS) 2-3V RIGHT COMPARISON:  None.  FINDINGS: Comminuted intertrochanteric fracture of the right hip is seen. No evidence of dislocation. IMPRESSION: Comminuted intertrochanteric fracture of right hip. Electronically Signed   By: Earle Gell M.D.   On: 10/31/2017 15:10    Pending Labs Unresulted Labs (From admission, onward)    Start     Ordered   10/31/17  1749  Urine Culture  STAT,   R     10/31/17 1748   10/31/17 1704  Urinalysis, Routine w reflex microscopic  STAT,   STAT     10/31/17 1704   Signed and Held  Basic metabolic panel  Tomorrow morning,   R     Signed and Held   Signed and Held  CBC  Tomorrow morning,   R     Signed and Held          Vitals/Pain Today's Vitals   10/31/17 1520 10/31/17 1527 10/31/17 1646 10/31/17 1746  BP:  (!) 154/84 (!) 158/121 (!) 163/74  Pulse:  83 75 84  Resp:  19 19 16   Temp:      TempSrc:      SpO2:  100% 93% 98%  Weight:      Height:      PainSc: 2        Isolation Precautions No active isolations  Medications Medications  morphine 2 MG/ML injection 1 mg (has no administration in time range)  hydrALAZINE (APRESOLINE) injection 10 mg (10 mg Intravenous Given 10/31/17 1747)  ondansetron (ZOFRAN) injection 4 mg (4 mg Intravenous Given 10/31/17 1702)    Mobility non-ambulatory

## 2017-10-31 NOTE — ED Notes (Signed)
Bed: OL41 Expected date: 10/31/17 Expected time: 2:11 PM Means of arrival: Ambulance Comments: Fall, hip pain

## 2017-10-31 NOTE — ED Provider Notes (Signed)
[facility] DEPT Provider Note   CSN: [id number] Arrival date & time: 10/31/17  1416     History   Chief Complaint Chief Complaint  Patient presents with  . Fall  . Hip Pain    HPI Sylvia Villarreal is a 84 y.o. female.  84 year old female brought in by EMS from home for right hip pain from a fall.  Patient had just finished lunch and stood up to go to her walker when she stumbled and fell landing on her right side on the carpet.  Patient did not hit her head, no loss of consciousness, the fall was witnessed by her daughter who is her power of attorney and is at bedside.  Patient has dementia, her daughter states that she tried to help her stand back up but she was in significant pain and so they called 911 for further assistance.  Patient is not on any blood thinners, no chronic lung history.  No other complaints or concerns.     Past Medical History:  Diagnosis Date  . Allergic rhinitis, cause unspecified   . Breast cancer ([place])   . Cramp of limb   . Osteoporosis, unspecified   . Other and unspecified hyperlipidemia   . Personal history of malignant neoplasm of breast   . Senile dementia   . Unspecified closed fracture of carpal bone   . Unspecified essential hypertension     Patient Active Problem List   Diagnosis Date Noted  . Closed right hip fracture ([place]) 10/31/2017  . Closed fracture of multiple pubic rami ([place]) 01/22/2014  . Cerebral thrombosis with cerebral infarction ([place]) 01/21/2014  . Stroke-like symptoms 01/20/2014  . Left leg weakness 01/20/2014  . Routine health maintenance 08/05/2012  . Senile dementia with delusional features ([place]) 01/13/2010  . ADENOCARCINOMA, BREAST, HX OF 10/09/2008  . [place], MILD 03/23/2007  . Essential hypertension 03/23/2007  . ALLERGIC RHINITIS 03/23/2007  . LEG CRAMPS, NOCTURNAL 03/23/2007  . OSTEOPOROSIS 03/23/2007  . FRACTURE, WRIST, LEFT 03/23/2007  . APPENDECTOMY, HX OF 03/23/2007     Past Surgical History:  Procedure Laterality Date  . ABDOMINAL HYSTERECTOMY    . APPENDECTOMY    . MODIFIED RADICAL MASTECTOMY W/ AXILLARY LYMPH NODE DISSECTION    . OOPHORECTOMY       OB History   None      Home Medications    Prior to Admission medications   Medication Sig Start Date End Date Taking? Authorizing Provider  Ibuprofen (ADVIL PO) Take by mouth every other day.   Yes [provider]  losartan (COZAAR) 100 MG tablet Take 1 tablet (100 mg total) by mouth daily. 09/19/17  Yes Nafziger, Tommi Rumps, NP  amLODipine (NORVASC) 2.5 MG tablet Take 1 tablet (2.5 mg total) by mouth daily. Patient not taking: Reported on 10/31/2017 07/14/17   Dorothyann Peng, NP    Family History Family History  Problem Relation Age of Onset  . Alzheimer's disease Mother   . Stroke Father   . Hypertension Father   . Colon cancer Neg Hx   . Breast cancer Neg Hx   . Diabetes Neg Hx     Social History Social History   Tobacco Use  . Smoking status: Never Smoker  . Smokeless tobacco: Never Used  Substance Use Topics  . Alcohol use: No  . Drug use: No     Allergies   Sulfonamide derivatives   Review of Systems Review of Systems  Unable to perform ROS: Dementia  Musculoskeletal: Positive  for arthralgias and gait problem.     Physical Exam Updated Vital Signs BP (!) 158/121 (BP Location: Right Arm)   Pulse 75   Temp 98.4 F (36.9 C) (Oral)   Resp 19   Ht 5\' 4"  (1.626 m)   Wt 57.6 kg   SpO2 93%   BMI 21.80 kg/m   Physical Exam  Constitutional: She appears well-developed and well-nourished. No distress.  HENT:  Head: Normocephalic and atraumatic.  Eyes: Pupils are equal, round, and reactive to light.  Neck: Normal range of motion.  Cardiovascular: Normal rate, regular rhythm, normal heart sounds and intact distal pulses.  No murmur heard. Pulmonary/Chest: Effort normal and breath sounds normal. No respiratory distress. She exhibits no tenderness.  Abdominal:  Soft. She exhibits no distension. There is no tenderness.  Musculoskeletal: She exhibits tenderness. She exhibits no deformity.       Right shoulder: She exhibits normal range of motion.       Left shoulder: She exhibits normal range of motion.       Right hip: She exhibits decreased range of motion and tenderness.       Left hip: She exhibits normal range of motion.       Legs: No pain over pelvis or with movement of upper extremities.  Neurological: She is alert. No sensory deficit. GCS eye subscore is 4. GCS verbal subscore is 1. GCS motor subscore is 6.  Follows commands, daughter states patient is at baseline mental status.  Skin: Skin is warm and dry. She is not diaphoretic.  Psychiatric: Her behavior is normal.  Nursing note and vitals reviewed.    ED Treatments / Results  Labs (all labs ordered are listed, but only abnormal results are displayed) Labs Reviewed  CBC WITH DIFFERENTIAL/PLATELET - Abnormal; Notable for the following components:      Result Value   WBC 14.4 (*)    Neutro Abs 12.7 (*)    All other components within normal limits  BASIC METABOLIC PANEL - Abnormal; Notable for the following components:   Glucose, Bld 156 (*)    Creatinine, Ser 1.30 (*)    Calcium 8.8 (*)    GFR calc non Af Amer 35 (*)    GFR calc Af Amer 40 (*)    All other components within normal limits  APTT - Abnormal; Notable for the following components:   aPTT 20 (*)    All other components within normal limits  PROTIME-INR  URINALYSIS, ROUTINE W REFLEX MICROSCOPIC  TYPE AND SCREEN    EKG EKG Interpretation  Date/Time:  Sunday October 31 2017 16:41:40 EDT Ventricular Rate:  80 PR Interval:    QRS Duration: 80 QT Interval:  404 QTC Calculation: 466 R Axis:   58 Text Interpretation:  Sinus rhythm Left ventricular hypertrophy Nonspecific T abnormalities, lateral leads Confirmed by Dene Gentry [phone]) on 10/31/2017 4:50:29 PM   Radiology Dg Chest Port 1 View  Result Date:  10/31/2017 CLINICAL DATA:  Right hip fracture. Preoperative evaluation. EXAM: PORTABLE CHEST 1 VIEW COMPARISON:  None. FINDINGS: Normal sized heart. Tortuous and calcified thoracic aorta. Elevated right hemidiaphragm with minimal linear atelectasis or scarring at the right lung base. Clear left lung. Diffuse osteopenia. IMPRESSION: Elevated right hemidiaphragm with minimal linear atelectasis or scarring at the right lung base. Otherwise, unremarkable examination. Electronically Signed   By: Claudie Revering M.D.   On: 10/31/2017 17:03   Dg Hip Unilat  With Pelvis 2-3 Views Right  Result Date: 10/31/2017 CLINICAL DATA:  Fall at home.  Right hip pain.  Initial encounter. EXAM: DG HIP (WITH OR WITHOUT PELVIS) 2-3V RIGHT COMPARISON:  None. FINDINGS: Comminuted intertrochanteric fracture of the right hip is seen. No evidence of dislocation. IMPRESSION: Comminuted intertrochanteric fracture of right hip. Electronically Signed   By: Earle Gell M.D.   On: 10/31/2017 15:10    Procedures Procedures (including critical care time)  Medications Ordered in ED Medications  morphine 2 MG/ML injection 1 mg (has no administration in time range)  ondansetron (ZOFRAN) injection 4 mg (4 mg Intravenous Given 10/31/17 1702)     Initial Impression / Assessment and Plan / ED Course  I have reviewed the triage vital signs and the nursing notes.  Pertinent labs & imaging results that were available during my care of the patient were reviewed by me and considered in my medical decision making (see chart for details).  Clinical Course as of Nov 01 1730  Sun Nov 01, 2358  2642 84 year old female brought in by EMS from home for fall with right hip pain. Patient has a history of dementia, non verbal but is ambulatory with a walker at home where she is cared for by her daughter and home health. Daughter/POA is at bedside, witnessed the fall. Patient stood up after lunch and was walking to her walker when she stumbled and fell to  her right, landing on her right side, was unable to stand up after the fall due to pain. Did not hit head, no LOC. On exam, pain with movement of the right hip, no left extremity or upper extremity pain, no neck or back tenderness. XR shows comminuted intertrochanteric right hip fracture.  Discussed with Dr. Francia Greaves, ER attending, agrees with plan to consult ortho. Discussed with Dr. Doran Durand, on call with orthopedics who will plan for surgery tomorrow, request hospitalist admission. Patient was given Fentanyl by EMS, resting quietly in bed, advised family to notify staff if pain meds are needed.   [LM]  1619 Discussed with hospitalist who will see the patient, labs pending.    [LM]    Clinical Course User Index [LM] Tacy Learn, PA-C    Final Clinical Impressions(s) / ED Diagnoses   Final diagnoses:  Closed fracture of right hip, initial encounter [facility])    ED Discharge Orders    None       Tacy Learn, PA-C 10/31/17 1732    Valarie Merino, MD 10/31/17 2330

## 2017-10-31 NOTE — H&P (View-Only) (Signed)
Reason for Consult:  Right hip pain Referring Physician: Dr. Ezenduka  Sylvia Villarreal is an 84 y.o. female.  HPI:  84 y/o female with PMH of dementia fell after getting up from lunch earlier today.  She was brought to the ER where xrays revealed a right hip comminuted intertroch fracture.  Her grand daughter is as the bedside and provides history.  By report the patient ambulates around the house with the use of a walker.  She goes to a day care center daily.  She is not diabetic and doesn't smoke.  Her daughter is POA.  Past Medical History:  Diagnosis Date  . Allergic rhinitis, cause unspecified   . Breast cancer (HCC)   . Cramp of limb   . Osteoporosis, unspecified   . Other and unspecified hyperlipidemia   . Personal history of malignant neoplasm of breast   . Senile dementia   . Unspecified closed fracture of carpal bone   . Unspecified essential hypertension     Past Surgical History:  Procedure Laterality Date  . ABDOMINAL HYSTERECTOMY    . APPENDECTOMY    . MODIFIED RADICAL MASTECTOMY W/ AXILLARY LYMPH NODE DISSECTION    . OOPHORECTOMY      Family History  Problem Relation Age of Onset  . Alzheimer's disease Mother   . Stroke Father   . Hypertension Father   . Colon cancer Neg Hx   . Breast cancer Neg Hx   . Diabetes Neg Hx     Social History:  reports that she has never smoked. She has never used smokeless tobacco. She reports that she does not drink alcohol or use drugs.  Allergies:  Allergies  Allergen Reactions  . Sulfonamide Derivatives Other (See Comments)    Unknown    Medications: I have reviewed the patient's current medications.  Results for orders placed or performed during the hospital encounter of 10/31/17 (from the past 48 hour(s))  Protime-INR     Status: None   Collection Time: 10/31/17  4:21 PM  Result Value Ref Range   Prothrombin Time 13.1 11.4 - 15.2 seconds   INR 1.00     Comment: Performed at [facility], [address] [place], [place] [address]  CBC with Differential     Status: Abnormal   Collection Time: 10/31/17  4:21 PM  Result Value Ref Range   WBC 14.4 (H) 4.0 - 10.5 K/uL   RBC 4.14 3.87 - 5.11 MIL/uL   Hemoglobin 13.2 12.0 - 15.0 g/dL   HCT 39.1 36.0 - 46.0 %   MCV 94.4 78.0 - 100.0 fL   MCH 31.9 26.0 - 34.0 pg   MCHC 33.8 30.0 - 36.0 g/dL   RDW 12.4 11.5 - 15.5 %   Platelets 264 150 - 400 K/uL   Neutrophils Relative % 88 %   Neutro Abs 12.7 (H) 1.7 - 7.7 K/uL   Lymphocytes Relative 8 %   Lymphs Abs 1.2 0.7 - 4.0 K/uL   Monocytes Relative 4 %   Monocytes Absolute 0.6 0.1 - 1.0 K/uL   Eosinophils Relative 0 %   Eosinophils Absolute 0.0 0.0 - 0.7 K/uL   Basophils Relative 0 %   Basophils Absolute 0.0 0.0 - 0.1 K/uL    Comment: Performed at [facility], [address] [place], [place] [address]  Basic metabolic panel     Status: Abnormal   Collection Time: 10/31/17  4:21 PM  Result Value Ref Range   Sodium   142 135 - 145 mmol/L   Potassium 3.7 3.5 - 5.1 mmol/L   Chloride 105 98 - 111 mmol/L   CO2 26 22 - 32 mmol/L   Glucose, Bld 156 (H) 70 - 99 mg/dL   BUN 22 8 - 23 mg/dL   Creatinine, Ser 1.30 (H) 0.44 - 1.00 mg/dL   Calcium 8.8 (L) 8.9 - 10.3 mg/dL   GFR calc non Af Amer 35 (L) >60 mL/min   GFR calc Af Amer 40 (L) >60 mL/min    Comment: (NOTE) The eGFR has been calculated using the CKD EPI equation. This calculation has not been validated in all clinical situations. eGFR's persistently <60 mL/min signify possible Chronic Kidney Disease.    Anion gap 11 5 - 15    Comment: Performed at [facility], [place] [address], [place], [place] [address]  APTT     Status: Abnormal   Collection Time: 10/31/17  4:21 PM  Result Value Ref Range   aPTT 20 (L) 24 - 36 seconds    Comment: Performed at [facility], [place] [address] [place], [place] [address]  Type and screen [place]     Status: None    Collection Time: 10/31/17  4:21 PM  Result Value Ref Range   ABO/RH(D) A POS    Antibody Screen NEG    Sample Expiration      11/03/2017 Performed at [facility], [place] [address], [place], [place] [address]     Dg Chest Port 1 View  Result Date: 10/31/2017 CLINICAL DATA:  Right hip fracture. Preoperative evaluation. EXAM: PORTABLE CHEST 1 VIEW COMPARISON:  None. FINDINGS: Normal sized heart. Tortuous and calcified thoracic aorta. Elevated right hemidiaphragm with minimal linear atelectasis or scarring at the right lung base. Clear left lung. Diffuse osteopenia. IMPRESSION: Elevated right hemidiaphragm with minimal linear atelectasis or scarring at the right lung base. Otherwise, unremarkable examination. Electronically Signed   By: Claudie Revering M.D.   On: 10/31/2017 17:03   Dg Hip Unilat  With Pelvis 2-3 Views Right  Result Date: 10/31/2017 CLINICAL DATA:  Fall at home.  Right hip pain.  Initial encounter. EXAM: DG HIP (WITH OR WITHOUT PELVIS) 2-3V RIGHT COMPARISON:  None. FINDINGS: Comminuted intertrochanteric fracture of the right hip is seen. No evidence of dislocation. IMPRESSION: Comminuted intertrochanteric fracture of right hip. Electronically Signed   By: Earle Gell M.D.   On: 10/31/2017 15:10    ROS:  No recent f/c/n/v/wt loss PE:  Blood pressure (!) 163/74, pulse 84, temperature 98.4 F (36.9 C), temperature source Oral, resp. rate 16, height 5' 4" (1.626 m), weight 57.6 kg, SpO2 98 %. wn wd elderly female in nad.  Alert.  Does not respond to questions (baseline per granddaughter).  R LE shortened and externally rotated.  2+ dp pulse.  Wiggles toes.  Appears to react to LT at the foot.  No lymphadenopathy.  Assessment/Plan: R hip displaced intertroch fracture - to OR for IM nailing tomorrow.  I explained the nature of the injury to the patient's family.  I've spoken with Dr. Lyla Glassing, and he's available to take her to the OR tomorrow.  I'll enter pre op orders  tonight.  NPO after midnight.    Wylene Simmer 10/31/2017, 7:58 PM

## 2017-10-31 NOTE — Consult Note (Signed)
Reason for Consult:  Right hip pain Referring Physician: Dr. Levada Schilling is an 84 y.o. female.  HPI:  84 y/o female with PMH of dementia fell after getting up from lunch earlier today.  She was brought to the ER where xrays revealed a right hip comminuted intertroch fracture.  Her grand daughter is as the bedside and provides history.  By report the patient ambulates around the house with the use of a walker.  She goes to a day care center daily.  She is not diabetic and doesn't smoke.  Her daughter is POA.  Past Medical History:  Diagnosis Date  . Allergic rhinitis, cause unspecified   . Breast cancer ([place])   . Cramp of limb   . Osteoporosis, unspecified   . Other and unspecified hyperlipidemia   . Personal history of malignant neoplasm of breast   . Senile dementia   . Unspecified closed fracture of carpal bone   . Unspecified essential hypertension     Past Surgical History:  Procedure Laterality Date  . ABDOMINAL HYSTERECTOMY    . APPENDECTOMY    . MODIFIED RADICAL MASTECTOMY W/ AXILLARY LYMPH NODE DISSECTION    . OOPHORECTOMY      Family History  Problem Relation Age of Onset  . Alzheimer's disease Mother   . Stroke Father   . Hypertension Father   . Colon cancer Neg Hx   . Breast cancer Neg Hx   . Diabetes Neg Hx     Social History:  reports that she has never smoked. She has never used smokeless tobacco. She reports that she does not drink alcohol or use drugs.  Allergies:  Allergies  Allergen Reactions  . Sulfonamide Derivatives Other (See Comments)    Unknown    Medications: I have reviewed the patient's current medications.  Results for orders placed or performed during the hospital encounter of 10/31/17 (from the past 48 hour(s))  Protime-INR     Status: None   Collection Time: 10/31/17  4:21 PM  Result Value Ref Range   Prothrombin Time 13.1 11.4 - 15.2 seconds   INR 1.00     Comment: Performed at [facility], [place]  [address] [place], [place] [address]  CBC with Differential     Status: Abnormal   Collection Time: 10/31/17  4:21 PM  Result Value Ref Range   WBC 14.4 (H) 4.0 - 10.5 K/uL   RBC 4.14 3.87 - 5.11 MIL/uL   Hemoglobin 13.2 12.0 - 15.0 g/dL   HCT 39.1 36.0 - 46.0 %   MCV 94.4 78.0 - 100.0 fL   MCH 31.9 26.0 - 34.0 pg   MCHC 33.8 30.0 - 36.0 g/dL   RDW 12.4 11.5 - 15.5 %   Platelets 264 150 - 400 K/uL   Neutrophils Relative % 88 %   Neutro Abs 12.7 (H) 1.7 - 7.7 K/uL   Lymphocytes Relative 8 %   Lymphs Abs 1.2 0.7 - 4.0 K/uL   Monocytes Relative 4 %   Monocytes Absolute 0.6 0.1 - 1.0 K/uL   Eosinophils Relative 0 %   Eosinophils Absolute 0.0 0.0 - 0.7 K/uL   Basophils Relative 0 %   Basophils Absolute 0.0 0.0 - 0.1 K/uL    Comment: Performed at [facility], [place] [address] [place], [place] [address]  Basic metabolic panel     Status: Abnormal   Collection Time: 10/31/17  4:21 PM  Result Value Ref Range   Sodium  142 135 - 145 mmol/L   Potassium 3.7 3.5 - 5.1 mmol/L   Chloride 105 98 - 111 mmol/L   CO2 26 22 - 32 mmol/L   Glucose, Bld 156 (H) 70 - 99 mg/dL   BUN 22 8 - 23 mg/dL   Creatinine, Ser 1.30 (H) 0.44 - 1.00 mg/dL   Calcium 8.8 (L) 8.9 - 10.3 mg/dL   GFR calc non Af Amer 35 (L) >60 mL/min   GFR calc Af Amer 40 (L) >60 mL/min    Comment: (NOTE) The eGFR has been calculated using the CKD EPI equation. This calculation has not been validated in all clinical situations. eGFR's persistently <60 mL/min signify possible Chronic Kidney Disease.    Anion gap 11 5 - 15    Comment: Performed at [facility], [place] [address] [place], [place] [address]  APTT     Status: Abnormal   Collection Time: 10/31/17  4:21 PM  Result Value Ref Range   aPTT 20 (L) 24 - 36 seconds    Comment: Performed at [facility], [place] [address], [place], [place] [address]  Type and screen [place]     Status: None    Collection Time: 10/31/17  4:21 PM  Result Value Ref Range   ABO/RH(D) A POS    Antibody Screen NEG    Sample Expiration      11/03/2017 Performed at [facility], [place] [address], [place], [place] [address]     Dg Chest Port 1 View  Result Date: 10/31/2017 CLINICAL DATA:  Right hip fracture. Preoperative evaluation. EXAM: PORTABLE CHEST 1 VIEW COMPARISON:  None. FINDINGS: Normal sized heart. Tortuous and calcified thoracic aorta. Elevated right hemidiaphragm with minimal linear atelectasis or scarring at the right lung base. Clear left lung. Diffuse osteopenia. IMPRESSION: Elevated right hemidiaphragm with minimal linear atelectasis or scarring at the right lung base. Otherwise, unremarkable examination. Electronically Signed   By: Claudie Revering M.D.   On: 10/31/2017 17:03   Dg Hip Unilat  With Pelvis 2-3 Views Right  Result Date: 10/31/2017 CLINICAL DATA:  Fall at home.  Right hip pain.  Initial encounter. EXAM: DG HIP (WITH OR WITHOUT PELVIS) 2-3V RIGHT COMPARISON:  None. FINDINGS: Comminuted intertrochanteric fracture of the right hip is seen. No evidence of dislocation. IMPRESSION: Comminuted intertrochanteric fracture of right hip. Electronically Signed   By: Earle Gell M.D.   On: 10/31/2017 15:10    ROS:  No recent f/c/n/v/wt loss PE:  Blood pressure (!) 163/74, pulse 84, temperature 98.4 F (36.9 C), temperature source Oral, resp. rate 16, height 5' 4" (1.626 m), weight 57.6 kg, SpO2 98 %. wn wd elderly female in nad.  Alert.  Does not respond to questions (baseline per granddaughter).  R LE shortened and externally rotated.  2+ dp pulse.  Wiggles toes.  Appears to react to LT at the foot.  No lymphadenopathy.  Assessment/Plan: R hip displaced intertroch fracture - to OR for IM nailing tomorrow.  I explained the nature of the injury to the patient's family.  I've spoken with Dr. Lyla Glassing, and he's available to take her to the OR tomorrow.  I'll enter pre op orders  tonight.  NPO after midnight.    Wylene Simmer 10/31/2017, 7:58 PM

## 2017-10-31 NOTE — H&P (Signed)
History and Physical  Sylvia Villarreal NWG:[id number] DOB: May 05, 1924 DOA: 10/31/2017   PCP: Dorothyann Peng, NP  Patient coming from: Home & is able to ambulate with a walker  Chief Complaint: R hip pain  HPI: Sylvia Villarreal is a 84 y.o. female with medical history significant for HTN, dementia presented to the ER after a fall on her R side, after she lost her balance while trying to stand up from a seated position and trying to grab her walker. Pt denies any chest pain, SOB, abdominal pain, fever/chills, cough. Pt is demented, and doesn't talk much.  ED Course: VSS except for uncontrolled HTN likely due to pain. Labs showed Cr 1.30, WBC 14.4, Xray of R hip showed comminuted intertrochanteric fracture. Ortho consulted. Pt admitted for further management  Review of Systems: Review of systems are otherwise negative   Past Medical History:  Diagnosis Date  . Allergic rhinitis, cause unspecified   . Breast cancer ([place])   . Cramp of limb   . Osteoporosis, unspecified   . Other and unspecified hyperlipidemia   . Personal history of malignant neoplasm of breast   . Senile dementia   . Unspecified closed fracture of carpal bone   . Unspecified essential hypertension    Past Surgical History:  Procedure Laterality Date  . ABDOMINAL HYSTERECTOMY    . APPENDECTOMY    . MODIFIED RADICAL MASTECTOMY W/ AXILLARY LYMPH NODE DISSECTION    . OOPHORECTOMY      Social History:  reports that she has never smoked. She has never used smokeless tobacco. She reports that she does not drink alcohol or use drugs.   Allergies  Allergen Reactions  . Sulfonamide Derivatives Other (See Comments)    Unknown    Family History  Problem Relation Age of Onset  . Alzheimer's disease Mother   . Stroke Father   . Hypertension Father   . Colon cancer Neg Hx   . Breast cancer Neg Hx   . Diabetes Neg Hx       Prior to Admission medications   Medication Sig Start Date End Date Taking? Authorizing Provider    Ibuprofen (ADVIL PO) Take by mouth every other day.   Yes [provider]  losartan (COZAAR) 100 MG tablet Take 1 tablet (100 mg total) by mouth daily. 09/19/17  Yes Nafziger, Tommi Rumps, NP  amLODipine (NORVASC) 2.5 MG tablet Take 1 tablet (2.5 mg total) by mouth daily. Patient not taking: Reported on 10/31/2017 07/14/17   Dorothyann Peng, NP    Physical Exam: BP (!) 158/121 (BP Location: Right Arm)   Pulse 75   Temp 98.4 F (36.9 C) (Oral)   Resp 19   Ht 5\' 4"  (1.626 m)   Wt 57.6 kg   SpO2 93%   BMI 21.80 kg/m   General: NAD  Eyes: Normal ENT: Normal Neck: Supple Cardiovascular: S1, S2 present Respiratory: CTAB Abdomen: Soft, NT, ND, BS present Skin: Normal Musculoskeletal: R hip tenderness, no pedal edema bilaterally Psychiatric: Unable to assess  Neurologic: No focal neurologic deficit          Labs on Admission:  Basic Metabolic Panel: Recent Labs  Lab 10/31/17 1621  NA 142  K 3.7  CL 105  CO2 26  GLUCOSE 156*  BUN 22  CREATININE 1.30*  CALCIUM 8.8*   Liver Function Tests: No results for input(s): AST, ALT, ALKPHOS, BILITOT, PROT, ALBUMIN in the last 168 hours. No results for input(s): LIPASE, AMYLASE in the last 168 hours. No  results for input(s): AMMONIA in the last 168 hours. CBC: Recent Labs  Lab 10/31/17 1621  WBC 14.4*  NEUTROABS 12.7*  HGB 13.2  HCT 39.1  MCV 94.4  PLT 264   Cardiac Enzymes: No results for input(s): CKTOTAL, CKMB, CKMBINDEX, TROPONINI in the last 168 hours.  BNP (last 3 results) No results for input(s): BNP in the last 8760 hours.  ProBNP (last 3 results) No results for input(s): PROBNP in the last 8760 hours.  CBG: No results for input(s): GLUCAP in the last 168 hours.  Radiological Exams on Admission: Dg Chest Port 1 View  Result Date: 10/31/2017 CLINICAL DATA:  Right hip fracture. Preoperative evaluation. EXAM: PORTABLE CHEST 1 VIEW COMPARISON:  None. FINDINGS: Normal sized heart. Tortuous and calcified  thoracic aorta. Elevated right hemidiaphragm with minimal linear atelectasis or scarring at the right lung base. Clear left lung. Diffuse osteopenia. IMPRESSION: Elevated right hemidiaphragm with minimal linear atelectasis or scarring at the right lung base. Otherwise, unremarkable examination. Electronically Signed   By: Claudie Revering M.D.   On: 10/31/2017 17:03   Dg Hip Unilat  With Pelvis 2-3 Views Right  Result Date: 10/31/2017 CLINICAL DATA:  Fall at home.  Right hip pain.  Initial encounter. EXAM: DG HIP (WITH OR WITHOUT PELVIS) 2-3V RIGHT COMPARISON:  None. FINDINGS: Comminuted intertrochanteric fracture of the right hip is seen. No evidence of dislocation. IMPRESSION: Comminuted intertrochanteric fracture of right hip. Electronically Signed   By: Earle Gell M.D.   On: 10/31/2017 15:10    EKG: Independently reviewed.  Assessment/Plan Present on Admission: . Closed right hip fracture ([place]) . Senile dementia with delusional features ([place]) . Essential hypertension  Principal Problem:   Closed right hip fracture (HCC) Active Problems:   Senile dementia with delusional features ([place])   Essential hypertension  Closed R hip fracture Due to mechanical fall X-ray showed R hip comminuted intertrochanteric fracture Ortho consulted NPO at midnight  Leukocytosis Afebrile, ??likely reactive UA/UC pending CXR showed elevated right hemidiaphragm with minimal linear atelectasis or scarring at the right lung base Daily CBC  CKD stage III Stable Daily BMP  Dementia Stable, usually quiet and doesn't say much    DVT prophylaxis: SCDs   Code Status: DNR   Family Communication: Daughters at bedside  Disposition Plan: TBD   Consults called: Orthopedics   Admission status: Inpatient    Alma Friendly MD [facility]   If 7PM-7AM, please contact night-coverage [email or web address]   10/31/2017, 5:27 PM

## 2017-10-31 NOTE — ED Triage Notes (Signed)
Pt BIBA. Pt from home, hx of dementia. Pt was ambulating at home and fell. Hx of right hip fx. Pt landed on right hip/coccyx area. Unable to get pt to straighten leg to assess for shortening and rotation. 170mcg fentanyl given en route.

## 2017-11-01 ENCOUNTER — Inpatient Hospital Stay (HOSPITAL_COMMUNITY): Payer: Medicare HMO | Admitting: Anesthesiology

## 2017-11-01 ENCOUNTER — Inpatient Hospital Stay (HOSPITAL_COMMUNITY): Payer: Medicare HMO

## 2017-11-01 ENCOUNTER — Encounter (HOSPITAL_COMMUNITY)
Admission: EM | Disposition: A | Discharge status: Skilled Nursing Facility | Payer: Self-pay | Source: Home / Self Care | Attending: Internal Medicine

## 2017-11-01 ENCOUNTER — Other Ambulatory Visit: Payer: Self-pay

## 2017-11-01 ENCOUNTER — Encounter (HOSPITAL_COMMUNITY): Payer: Self-pay | Admitting: Certified Registered Nurse Anesthetist

## 2017-11-01 DIAGNOSIS — I10 Essential (primary) hypertension: Secondary | ICD-10-CM

## 2017-11-01 DIAGNOSIS — S72001A Fracture of unspecified part of neck of right femur, initial encounter for closed fracture: Secondary | ICD-10-CM

## 2017-11-01 DIAGNOSIS — F0390 Unspecified dementia without behavioral disturbance: Secondary | ICD-10-CM

## 2017-11-01 DIAGNOSIS — F22 Delusional disorders: Secondary | ICD-10-CM

## 2017-11-01 DIAGNOSIS — S72141A Displaced intertrochanteric fracture of right femur, initial encounter for closed fracture: Secondary | ICD-10-CM | POA: Diagnosis present

## 2017-11-01 HISTORY — PX: INTRAMEDULLARY (IM) NAIL INTERTROCHANTERIC: SHX5875

## 2017-11-01 LAB — CBC
HCT: 33.6 % — ABNORMAL LOW (ref 36.0–46.0)
Hemoglobin: 11.2 g/dL — ABNORMAL LOW (ref 12.0–15.0)
MCH: 31.4 pg (ref 26.0–34.0)
MCHC: 33.3 g/dL (ref 30.0–36.0)
MCV: 94.1 fL (ref 78.0–100.0)
Platelets: 242 10*3/uL (ref 150–400)
RBC: 3.57 MIL/uL — ABNORMAL LOW (ref 3.87–5.11)
RDW: 12.5 % (ref 11.5–15.5)
WBC: 12.3 10*3/uL — ABNORMAL HIGH (ref 4.0–10.5)

## 2017-11-01 LAB — URINALYSIS, ROUTINE W REFLEX MICROSCOPIC
BILIRUBIN URINE: NEGATIVE
GLUCOSE, UA: NEGATIVE mg/dL
HGB URINE DIPSTICK: NEGATIVE
Ketones, ur: 5 mg/dL — AB
Leukocytes, UA: NEGATIVE
NITRITE: NEGATIVE
PH: 5 (ref 5.0–8.0)
Protein, ur: NEGATIVE mg/dL
SPECIFIC GRAVITY, URINE: 1.021 (ref 1.005–1.030)

## 2017-11-01 LAB — BASIC METABOLIC PANEL
Anion gap: 9 (ref 5–15)
BUN: 24 mg/dL — ABNORMAL HIGH (ref 8–23)
CALCIUM: 8.6 mg/dL — AB (ref 8.9–10.3)
CO2: 25 mmol/L (ref 22–32)
CREATININE: 1.26 mg/dL — AB (ref 0.44–1.00)
Chloride: 107 mmol/L (ref 98–111)
GFR calc Af Amer: 42 mL/min — ABNORMAL LOW (ref >60)
GFR, EST NON AFRICAN AMERICAN: 36 mL/min — AB (ref >60)
GLUCOSE: 131 mg/dL — AB (ref 70–99)
Potassium: 4.2 mmol/L (ref 3.5–5.1)
Sodium: 141 mmol/L (ref 135–145)

## 2017-11-01 LAB — MRSA PCR SCREENING: MRSA BY PCR: NEGATIVE

## 2017-11-01 LAB — ABO/RH: ABO/RH(D): A POS

## 2017-11-01 SURGERY — FIXATION, FRACTURE, INTERTROCHANTERIC, WITH INTRAMEDULLARY ROD
Anesthesia: General | Site: Hip | Laterality: Right

## 2017-11-01 MED ORDER — ONDANSETRON HCL 4 MG/2ML IJ SOLN
INTRAMUSCULAR | Status: AC
  Filled 2017-11-01: qty 2

## 2017-11-01 MED ORDER — CHLORHEXIDINE GLUCONATE 4 % EX LIQD
60.0000 mL | Freq: Once | CUTANEOUS | Status: AC
  Administered 2017-11-01: 4 via TOPICAL
  Filled 2017-11-01: qty 60

## 2017-11-01 MED ORDER — OXYCODONE HCL ER 10 MG PO T12A
10.0000 mg | EXTENDED_RELEASE_TABLET | Freq: Two times a day (BID) | ORAL | Status: DC
  Administered 2017-11-02 – 2017-11-08 (×9): 10 mg via ORAL
  Filled 2017-11-01 (×12): qty 1

## 2017-11-01 MED ORDER — SODIUM CHLORIDE 0.9 % IV SOLN
INTRAVENOUS | Status: DC
  Administered 2017-11-01: via INTRAVENOUS

## 2017-11-01 MED ORDER — MENTHOL 3 MG MT LOZG: 1.0000 | LOZENGE | OROMUCOSAL | Status: DC | PRN

## 2017-11-01 MED ORDER — METOCLOPRAMIDE HCL 5 MG PO TABS: 5.0000 mg | ORAL_TABLET | Freq: Three times a day (TID) | ORAL | Status: DC | PRN

## 2017-11-01 MED ORDER — LACTATED RINGERS IV SOLN
INTRAVENOUS | Status: DC
  Administered 2017-11-01 – 2017-11-08 (×11): via INTRAVENOUS

## 2017-11-01 MED ORDER — LIDOCAINE 2% (20 MG/ML) 5 ML SYRINGE
INTRAMUSCULAR | Status: DC | PRN
  Administered 2017-11-01: 60 mg via INTRAVENOUS

## 2017-11-01 MED ORDER — SODIUM CHLORIDE 0.9 % IR SOLN
Status: DC | PRN
  Administered 2017-11-01: 1000 mL

## 2017-11-01 MED ORDER — PHENOL 1.4 % MT LIQD
1.0000 | OROMUCOSAL | Status: DC | PRN
  Filled 2017-11-01: qty 177

## 2017-11-01 MED ORDER — PROPOFOL 10 MG/ML IV BOLUS
INTRAVENOUS | Status: DC | PRN
  Administered 2017-11-01: 80 mg via INTRAVENOUS

## 2017-11-01 MED ORDER — SENNOSIDES-DOCUSATE SODIUM 8.6-50 MG PO TABS
2.0000 | ORAL_TABLET | Freq: Two times a day (BID) | ORAL | Status: DC
  Administered 2017-11-03 – 2017-11-08 (×5): 2 via ORAL
  Filled 2017-11-01 (×8): qty 2

## 2017-11-01 MED ORDER — FENTANYL CITRATE (PF) 100 MCG/2ML IJ SOLN
INTRAMUSCULAR | Status: AC
  Filled 2017-11-01: qty 2

## 2017-11-01 MED ORDER — MORPHINE SULFATE (PF) 4 MG/ML IV SOLN: 1.0000 mg | INTRAVENOUS | Status: DC | PRN

## 2017-11-01 MED ORDER — LIDOCAINE 2% (20 MG/ML) 5 ML SYRINGE
INTRAMUSCULAR | Status: AC
  Filled 2017-11-01: qty 5

## 2017-11-01 MED ORDER — PHENYLEPHRINE 40 MCG/ML (10ML) SYRINGE FOR IV PUSH (FOR BLOOD PRESSURE SUPPORT)
PREFILLED_SYRINGE | INTRAVENOUS | Status: AC
  Filled 2017-11-01: qty 10

## 2017-11-01 MED ORDER — SUCCINYLCHOLINE CHLORIDE 200 MG/10ML IV SOSY
PREFILLED_SYRINGE | INTRAVENOUS | Status: AC
  Filled 2017-11-01: qty 10

## 2017-11-01 MED ORDER — ESMOLOL HCL 100 MG/10ML IV SOLN
INTRAVENOUS | Status: DC | PRN
  Administered 2017-11-01: 20 mg via INTRAVENOUS
  Administered 2017-11-01: 30 mg via INTRAVENOUS
  Administered 2017-11-01 (×2): 20 mg via INTRAVENOUS

## 2017-11-01 MED ORDER — ENOXAPARIN SODIUM 30 MG/0.3ML ~~LOC~~ SOLN
30.0000 mg | SUBCUTANEOUS | Status: DC
  Administered 2017-11-02 – 2017-11-05 (×4): 30 mg via SUBCUTANEOUS
  Filled 2017-11-01 (×5): qty 0.3

## 2017-11-01 MED ORDER — HYDROMORPHONE HCL 1 MG/ML IJ SOLN
0.5000 mg | INTRAMUSCULAR | Status: DC | PRN
  Administered 2017-11-01 – 2017-11-07 (×10): 0.5 mg via INTRAVENOUS
  Filled 2017-11-01 (×10): qty 0.5

## 2017-11-01 MED ORDER — ISOPROPYL ALCOHOL 70 % SOLN
Status: AC
  Filled 2017-11-01: qty 480

## 2017-11-01 MED ORDER — DEXAMETHASONE SODIUM PHOSPHATE 4 MG/ML IJ SOLN
INTRAMUSCULAR | Status: DC | PRN
  Administered 2017-11-01: 5 mg via INTRAVENOUS

## 2017-11-01 MED ORDER — ROCURONIUM BROMIDE 10 MG/ML (PF) SYRINGE
PREFILLED_SYRINGE | INTRAVENOUS | Status: AC
  Filled 2017-11-01: qty 10

## 2017-11-01 MED ORDER — PHENYLEPHRINE 40 MCG/ML (10ML) SYRINGE FOR IV PUSH (FOR BLOOD PRESSURE SUPPORT)
PREFILLED_SYRINGE | INTRAVENOUS | Status: DC | PRN
  Administered 2017-11-01 (×4): 80 ug via INTRAVENOUS

## 2017-11-01 MED ORDER — CEFAZOLIN SODIUM-DEXTROSE 2-4 GM/100ML-% IV SOLN
2.0000 g | INTRAVENOUS | Status: AC
  Administered 2017-11-01: 2 g via INTRAVENOUS
  Filled 2017-11-01: qty 100

## 2017-11-01 MED ORDER — ROCURONIUM BROMIDE 10 MG/ML (PF) SYRINGE
PREFILLED_SYRINGE | INTRAVENOUS | Status: DC | PRN
  Administered 2017-11-01: 35 mg via INTRAVENOUS

## 2017-11-01 MED ORDER — DEXAMETHASONE SODIUM PHOSPHATE 10 MG/ML IJ SOLN
INTRAMUSCULAR | Status: AC
  Filled 2017-11-01: qty 1

## 2017-11-01 MED ORDER — DOCUSATE SODIUM 100 MG PO CAPS
100.0000 mg | ORAL_CAPSULE | Freq: Two times a day (BID) | ORAL | Status: DC
  Administered 2017-11-03 – 2017-11-08 (×4): 100 mg via ORAL
  Filled 2017-11-01 (×8): qty 1

## 2017-11-01 MED ORDER — SUGAMMADEX SODIUM 200 MG/2ML IV SOLN
INTRAVENOUS | Status: AC
  Filled 2017-11-01: qty 2

## 2017-11-01 MED ORDER — SUGAMMADEX SODIUM 200 MG/2ML IV SOLN
INTRAVENOUS | Status: DC | PRN
  Administered 2017-11-01: 150 mg via INTRAVENOUS

## 2017-11-01 MED ORDER — PROMETHAZINE HCL 25 MG/ML IJ SOLN: 6.2500 mg | INTRAMUSCULAR | Status: DC | PRN

## 2017-11-01 MED ORDER — SUCCINYLCHOLINE CHLORIDE 200 MG/10ML IV SOSY
PREFILLED_SYRINGE | INTRAVENOUS | Status: DC | PRN
  Administered 2017-11-01: 100 mg via INTRAVENOUS

## 2017-11-01 MED ORDER — CEFAZOLIN SODIUM-DEXTROSE 2-4 GM/100ML-% IV SOLN
2.0000 g | Freq: Four times a day (QID) | INTRAVENOUS | Status: AC
  Administered 2017-11-02 (×2): 2 g via INTRAVENOUS
  Filled 2017-11-01 (×2): qty 100

## 2017-11-01 MED ORDER — POVIDONE-IODINE 10 % EX SWAB
2.0000 "application " | Freq: Once | CUTANEOUS | Status: AC
  Administered 2017-11-01: 2 via TOPICAL

## 2017-11-01 MED ORDER — ISOPROPYL ALCOHOL 70 % SOLN
Status: DC | PRN
  Administered 2017-11-01: 1 via TOPICAL

## 2017-11-01 MED ORDER — FENTANYL CITRATE (PF) 100 MCG/2ML IJ SOLN
INTRAMUSCULAR | Status: DC | PRN
  Administered 2017-11-01: 25 ug via INTRAVENOUS
  Administered 2017-11-01: 50 ug via INTRAVENOUS
  Administered 2017-11-01: 25 ug via INTRAVENOUS

## 2017-11-01 MED ORDER — ORAL CARE MOUTH RINSE
15.0000 mL | Freq: Two times a day (BID) | OROMUCOSAL | Status: DC
  Administered 2017-11-03 – 2017-11-08 (×6): 15 mL via OROMUCOSAL

## 2017-11-01 MED ORDER — METOCLOPRAMIDE HCL 5 MG/ML IJ SOLN: 5.0000 mg | Freq: Three times a day (TID) | INTRAMUSCULAR | Status: DC | PRN

## 2017-11-01 SURGICAL SUPPLY — 45 items
ADH SKN CLS APL DERMABOND .7 (GAUZE/BANDAGES/DRESSINGS) ×1
BAG SPEC THK2 15X12 ZIP CLS (MISCELLANEOUS)
BAG ZIPLOCK 12X15 (MISCELLANEOUS) IMPLANT
BIT DRILL CANN LG 4.3MM (BIT) IMPLANT
CHLORAPREP W/TINT 26ML (MISCELLANEOUS) ×2 IMPLANT
COVER PERINEAL POST (MISCELLANEOUS) ×2 IMPLANT
COVER SURGICAL LIGHT HANDLE (MISCELLANEOUS) ×2 IMPLANT
DERMABOND ADVANCED (GAUZE/BANDAGES/DRESSINGS) ×1
DERMABOND ADVANCED .7 DNX12 (GAUZE/BANDAGES/DRESSINGS) ×2 IMPLANT
DRAPE C-ARM 42X120 X-RAY (DRAPES) ×2 IMPLANT
DRAPE C-ARMOR (DRAPES) ×2 IMPLANT
DRAPE IMP U-DRAPE 54X76 (DRAPES) ×4 IMPLANT
DRAPE SHEET LG 3/4 BI-LAMINATE (DRAPES) ×4 IMPLANT
DRAPE STERI IOBAN 125X83 (DRAPES) ×2 IMPLANT
DRAPE U-SHAPE 47X51 STRL (DRAPES) ×5 IMPLANT
DRILL BIT CANN LG 4.3MM (BIT) ×2
DRSG MEPILEX BORDER 4X4 (GAUZE/BANDAGES/DRESSINGS) ×3 IMPLANT
DRSG MEPILEX BORDER 4X8 (GAUZE/BANDAGES/DRESSINGS) ×1 IMPLANT
ELECT BLADE TIP CTD 4 INCH (ELECTRODE) ×1 IMPLANT
FACESHIELD WRAPAROUND (MASK) ×2 IMPLANT
FACESHIELD WRAPAROUND OR TEAM (MASK) ×2 IMPLANT
GAUZE SPONGE 4X4 12PLY STRL (GAUZE/BANDAGES/DRESSINGS) ×2 IMPLANT
GLOVE BIO SURGEON STRL SZ8.5 (GLOVE) ×4 IMPLANT
GLOVE BIOGEL PI IND STRL 7.0 (GLOVE) IMPLANT
GLOVE BIOGEL PI IND STRL 7.5 (GLOVE) IMPLANT
GLOVE BIOGEL PI IND STRL 8.5 (GLOVE) ×1 IMPLANT
GLOVE BIOGEL PI INDICATOR 7.0 (GLOVE) ×1
GLOVE BIOGEL PI INDICATOR 7.5 (GLOVE) ×1
GLOVE BIOGEL PI INDICATOR 8.5 (GLOVE) ×1
GLOVE SURG SS PI 7.0 STRL IVOR (GLOVE) ×1 IMPLANT
GOWN SPEC L3 XXLG W/TWL (GOWN DISPOSABLE) ×2 IMPLANT
GOWN STRL REUS W/TWL LRG LVL3 (GOWN DISPOSABLE) ×1 IMPLANT
GUIDEPIN 3.2X17.5 THRD DISP (PIN) ×1 IMPLANT
HIP FRA NAIL LAG SCREW 10.5X90 (Orthopedic Implant) ×2 IMPLANT
KIT BASIN OR (CUSTOM PROCEDURE TRAY) ×2 IMPLANT
MANIFOLD NEPTUNE II (INSTRUMENTS) ×2 IMPLANT
MARKER SKIN DUAL TIP RULER LAB (MISCELLANEOUS) ×2 IMPLANT
NAIL HIP FRACT 130D 11X180 (Screw) ×1 IMPLANT
PACK GENERAL/GYN (CUSTOM PROCEDURE TRAY) ×2 IMPLANT
SCREW BONE CORTICAL 5.0X32 (Screw) ×1 IMPLANT
SCREW LAG HIP FRA NAIL 10.5X90 (Orthopedic Implant) IMPLANT
SUT MNCRL AB 3-0 PS2 18 (SUTURE) ×3 IMPLANT
SUT MON AB 2-0 CT1 36 (SUTURE) ×1 IMPLANT
SUT VIC AB 1 CT1 36 (SUTURE) ×3 IMPLANT
TOWEL OR 17X26 10 PK STRL BLUE (TOWEL DISPOSABLE) ×2 IMPLANT

## 2017-11-01 NOTE — Progress Notes (Signed)
PROGRESS NOTE  Sylvia Villarreal:[id number] DOB: 04/13/1924 DOA: 10/31/2017 PCP: Dorothyann Peng, NP  HPI/Recap of past 24 hours: Sylvia Villarreal is a 84 y.o. female with medical history significant for HTN, dementia presented to the ER after a fall on her R side, after she lost her balance while trying to stand up from a seated position and trying to grab her walker. Pt denies any chest pain, SOB, abdominal pain, fever/chills, cough. Pt is demented, and doesn't talk much. Xray of R hip showed comminuted intertrochanteric fracture. Ortho consulted. Pt admitted for further management.  11/01/2017: Patient seen and examined with her daughter at bedside.  Daughter patient has had breakthrough pain.  Started on OxyContin 10 mg twice daily as the patient has dementia and not able to ask for as needed pain medications.  Added IV Dilaudid as needed for severe pain.  Assessment/Plan: Principal Problem:   Closed right hip fracture (HCC) Active Problems:   Senile dementia with delusional features ([place])   Essential hypertension  Closed right hip fracture secondary to mechanical fall X-ray showed R hip comminuted intertrochanteric fracture Ortho consulted and following. Right hip surgical repair planned today 11/01/2017. Pain management in place with OxyContin 10 mg twice daily for moderate pain, IV Dilaudid 0.5 mg every 4 hours as needed for sleep.  Bowel regimen in place with Senokot 2 tablets twice daily  Accelerated hypertension most likely related to her pain N.p.o. due to planned procedure IV hydralazine as needed for hypertension Control pain  Leukocytosis likely reactive UA chest X chest x-ray unremarkable WBC trending down Repeat CBC in the morning  CKD stage III Stable Back to her baseline creatinine 1.26 Cr presentation 1.30 Avoid nephrotoxic agent/dehydration/hypotension Obtain BMP in the morning  Dementia Stable, usually quiet and doesn't say much Reorient as needed Fall  precautions      Code Status: DNR  Family Communication: Daughter at bedside  Disposition Plan: SNF/short-term rehab post surgery when orthopedic surgery signs off   Consultants:  Orthopedic surgery  Procedures:  Surgery planned today 11/01/2017  Antimicrobials:  None  DVT prophylaxis: SCDs   Objective: Vitals:   10/31/17 1646 10/31/17 1746 10/31/17 2226 11/01/17 0530  BP: (!) 158/121 (!) 163/74 (!) 150/77 138/67  Pulse: 75 84 (!) 111 (!) 112  Resp: 19 16 16 18   Temp:   98.4 F (36.9 C) 98.3 F (36.8 C)  TempSrc:   Oral Oral  SpO2: 93% 98% 98% 95%  Weight:    60.2 kg  Height:        Intake/Output Summary (Last 24 hours) at 11/01/2017 1142 Last data filed at 11/01/2017 1053 Gross per 24 hour  Intake 287.83 ml  Output 225 ml  Net 62.83 ml   Filed Weights   10/31/17 1427 11/01/17 0530  Weight: 57.6 kg 60.2 kg    Exam:  . General: 84 y.o. year-old female well developed well nourished in no acute distress.  Alert and minimally talkative in the setting of dementia.  . Cardiovascular: Regular rate and rhythm with no rubs or gallops.  No JVD or thyromegaly noted. Marland Kitchen Respiratory: Clear to auscultation with no wheezes or rales. Good inspiratory effort. . Abdomen: Soft nontender nondistended with normal bowel sounds x4 quadrants. . Musculoskeletal: Shortened and externally rotated right lower extremity.  Dorsalis pedis pulses intact. Marland Kitchen Psychiatry: Mood is appropriate for condition and setting   Data Reviewed: CBC: Recent Labs  Lab 10/31/17 1621 11/01/17 0400  WBC 14.4* 12.3*  NEUTROABS 12.7*  --  HGB 13.2 11.2*  HCT 39.1 33.6*  MCV 94.4 94.1  PLT 264 481   Basic Metabolic Panel: Recent Labs  Lab 10/31/17 1621 11/01/17 0400  NA 142 141  K 3.7 4.2  CL 105 107  CO2 26 25  GLUCOSE 156* 131*  BUN 22 24*  CREATININE 1.30* 1.26*  CALCIUM 8.8* 8.6*   GFR: Estimated Creatinine Clearance: 25.1 mL/min (A) (by C-G formula based on SCr of 1.26 mg/dL  (H)). Liver Function Tests: No results for input(s): AST, ALT, ALKPHOS, BILITOT, PROT, ALBUMIN in the last 168 hours. No results for input(s): LIPASE, AMYLASE in the last 168 hours. No results for input(s): AMMONIA in the last 168 hours. Coagulation Profile: Recent Labs  Lab 10/31/17 1621  INR 1.00   Cardiac Enzymes: No results for input(s): CKTOTAL, CKMB, CKMBINDEX, TROPONINI in the last 168 hours. BNP (last 3 results) No results for input(s): PROBNP in the last 8760 hours. HbA1C: No results for input(s): HGBA1C in the last 72 hours. CBG: No results for input(s): GLUCAP in the last 168 hours. Lipid Profile: No results for input(s): CHOL, HDL, LDLCALC, TRIG, CHOLHDL, LDLDIRECT in the last 72 hours. Thyroid Function Tests: No results for input(s): TSH, T4TOTAL, FREET4, T3FREE, THYROIDAB in the last 72 hours. Anemia Panel: No results for input(s): VITAMINB12, FOLATE, FERRITIN, TIBC, IRON, RETICCTPCT in the last 72 hours. Urine analysis:    Component Value Date/Time   COLORURINE YELLOW 11/01/2017 0530   APPEARANCEUR CLEAR 11/01/2017 0530   LABSPEC 1.021 11/01/2017 0530   PHURINE 5.0 11/01/2017 0530   GLUCOSEU NEGATIVE 11/01/2017 0530   HGBUR NEGATIVE 11/01/2017 0530   BILIRUBINUR NEGATIVE 11/01/2017 0530   KETONESUR 5 (A) 11/01/2017 0530   PROTEINUR NEGATIVE 11/01/2017 0530   UROBILINOGEN 0.2 01/20/2014 1605   NITRITE NEGATIVE 11/01/2017 0530   LEUKOCYTESUR NEGATIVE 11/01/2017 0530   Sepsis Labs: @LABRCNTIP (procalcitonin:4,lacticidven:4)  ) Recent Results (from the past 240 hour(s))  MRSA PCR Screening     Status: None   Collection Time: 11/01/17  2:00 AM  Result Value Ref Range Status   MRSA by PCR NEGATIVE NEGATIVE Final    Comment:        The GeneXpert MRSA Assay (FDA approved for NASAL specimens only), is one component of a comprehensive MRSA colonization surveillance program. It is not intended to diagnose MRSA infection nor to guide or monitor treatment  for MRSA infections. Performed at [facility], [place] [address], [place], [place] [address]       Studies: Dg Chest Port 1 View  Result Date: 10/31/2017 CLINICAL DATA:  Right hip fracture. Preoperative evaluation. EXAM: PORTABLE CHEST 1 VIEW COMPARISON:  None. FINDINGS: Normal sized heart. Tortuous and calcified thoracic aorta. Elevated right hemidiaphragm with minimal linear atelectasis or scarring at the right lung base. Clear left lung. Diffuse osteopenia. IMPRESSION: Elevated right hemidiaphragm with minimal linear atelectasis or scarring at the right lung base. Otherwise, unremarkable examination. Electronically Signed   By: Claudie Revering M.D.   On: 10/31/2017 17:03   Dg Hip Unilat  With Pelvis 2-3 Views Right  Result Date: 10/31/2017 CLINICAL DATA:  Fall at home.  Right hip pain.  Initial encounter. EXAM: DG HIP (WITH OR WITHOUT PELVIS) 2-3V RIGHT COMPARISON:  None. FINDINGS: Comminuted intertrochanteric fracture of the right hip is seen. No evidence of dislocation. IMPRESSION: Comminuted intertrochanteric fracture of right hip. Electronically Signed   By: Earle Gell M.D.   On: 10/31/2017 15:10    Scheduled Meds: . oxyCODONE  10 mg  Oral Q12H  . povidone-iodine  2 application Topical Once  . senna-docusate  2 tablet Oral BID    Continuous Infusions: . sodium chloride 50 mL/hr at 11/01/17 0012  .  ceFAZolin (ANCEF) IV       LOS: 1 day     Kayleen Memos, MD [facility] Pager [phone]  If 7PM-7AM, please contact night-coverage [email or web address] Password [email or web address] 11/01/2017, 11:42 AM

## 2017-11-01 NOTE — Anesthesia Preprocedure Evaluation (Signed)
Anesthesia Evaluation  Patient identified by MRN, date of birth, ID band Patient awake    Reviewed: Allergy & Precautions, NPO status , Patient's Chart, lab work & pertinent test results  Airway Mallampati: II  TM Distance: >3 FB Neck ROM: Limited    Dental no notable dental hx.    Pulmonary neg pulmonary ROS,    Pulmonary exam normal breath sounds clear to auscultation       Cardiovascular hypertension, Normal cardiovascular exam Rhythm:Regular Rate:Normal     Neuro/Psych Dementia CVA    GI/Hepatic negative GI ROS, Neg liver ROS,   Endo/Other  negative endocrine ROS  Renal/GU negative Renal ROS  negative genitourinary   Musculoskeletal negative musculoskeletal ROS (+)   Abdominal   Peds negative pediatric ROS (+)  Hematology negative hematology ROS (+)   Anesthesia Other Findings   Reproductive/Obstetrics negative OB ROS                             Anesthesia Physical Anesthesia Plan  ASA: III  Anesthesia Plan: General   Post-op Pain Management:    Induction: Intravenous  PONV Risk Score and Plan: 3 and Ondansetron, Dexamethasone and Treatment may vary due to age or medical condition  Airway Management Planned: Oral ETT  Additional Equipment:   Intra-op Plan:   Post-operative Plan: Extubation in OR  Informed Consent: I have reviewed the patients History and Physical, chart, labs and discussed the procedure including the risks, benefits and alternatives for the proposed anesthesia with the patient or authorized representative who has indicated his/her understanding and acceptance.   Dental advisory given  Plan Discussed with: CRNA and Surgeon  Anesthesia Plan Comments:         Anesthesia Quick Evaluation

## 2017-11-01 NOTE — Op Note (Signed)
OPERATIVE REPORT  SURGEON: Rod Can, MD   ASSISTANT: Staff.  PREOPERATIVE DIAGNOSIS: Right intertrochanteric femur fracture.   POSTOPERATIVE DIAGNOSIS: Right intertrochanteric femur fracture.   PROCEDURE: Intramedullary fixation, Right femur.   IMPLANTS: Biomet Affixus Hip Fracture Nail, 11 by 180 mm, 130 degrees. 10.5 x 90 mm Hip Fracture Nail Lag Screw. 5 x 32 mm distal interlocking screw 1.  ANESTHESIA:  General  ESTIMATED BLOOD LOSS:-10 mL    ANTIBIOTICS: 2 g Ancef.  DRAINS: None.  COMPLICATIONS: None.   CONDITION: PACU - hemodynamically stable.   BRIEF CLINICAL NOTE: Sylvia Villarreal is a 84 y.o. female who presented with an intertrochanteric femur fracture. The patient was admitted to the hospitalist service and underwent perioperative risk stratification and medical optimization. The risks, benefits, and alternatives to the procedure were explained, and the patient elected to proceed.  PROCEDURE IN DETAIL: Surgical site was marked by myself. The patient was taken to the operating room and anesthesia was induced on the bed. The patient was then transferred to the [facility] table and the nonoperative lower extremity was scissored underneath the operative side. The fracture was reduced with traction, internal rotation, and adduction. The hip was prepped and draped in the normal sterile surgical fashion. Timeout was called verifying side and site of surgery. Preop antibiotics were given with 60 minutes of beginning the procedure.  Fluoroscopy was used to define the patient's anatomy. A 4 cm incision was made just proximal to the tip of the greater trochanter. The awl was used to obtain the standard starting point for a trochanteric entry nail under fluoroscopic control. The guidepin was placed. The entry reamer was used to open the proximal femur.  On the back table, the nail was assembled onto the jig. The nail was placed into the femur without any difficulty. Through a separate  stab incision, the cannula was placed down to the bone in preparation for the cephalomedullary device. A guidepin was placed into the femoral head using AP and lateral fluoroscopy views. The pin was measured, and then reaming was performed to the appropriate depth. The lag screw was inserted to the appropriate depth. The fracture was compressed through the jig. The setscrew was tightened and then loosened one quarter turn. A separate stab incision was created, and the distal interlocking screw was placed using standard AO technique. The jig was removed. Final AP and lateral fluoroscopy views were obtained to confirm fracture reduction and hardware placement. Tip apex distance was appropriate. There was no chondral penetration.  The wounds were copiously irrigated with saline. The wound was closed in layers with #1 Vicryl for the fascia, 2-0 Monocryl for the deep dermal layer, and 3-0 Monocryl subcuticular stitch. Glue was applied to the skin. Once the glue was fully hardened, sterile dressing was applied. The patient was then awakened from anesthesia and taken to the PACU in stable condition. Sponge needle and instrument counts were correct at the end of the case 2. There were no known complications.  We will readmit the patient to the hospitalist. Weightbearing status will be weightbearing as tolerated with a walker. We will begin Lovenox for DVT prophylaxis. The patient will work with physical therapy and undergo disposition planning.

## 2017-11-01 NOTE — Interval H&P Note (Signed)
History and Physical Interval Note:  11/01/2017 5:36 PM  Sylvia Villarreal  has presented today for surgery, with the diagnosis of right intertrochanteric hip fracture  The various methods of treatment have been discussed with the patient and family. After consideration of risks, benefits and other options for treatment, the patient has consented to  Procedure(s): INTRAMEDULLARY (IM) NAIL INTERTROCHANTRIC (Right) as a surgical intervention .  The patient's history has been reviewed, patient examined, no change in status, stable for surgery.  I have reviewed the patient's chart and labs.  Questions were answered to the patient's satisfaction.    The risks, benefits, and alternatives were discussed with the patient / family. There are risks associated with the surgery including, but not limited to, problems with anesthesia (death), infection, differences in leg length/angulation/rotation, fracture of bones, loosening or failure of implants, malunion, nonunion, hematoma (blood accumulation) which may require surgical drainage, blood clots, pulmonary embolism, nerve injury (foot drop), and blood vessel injury. The patient / family understand these risks and elects to proceed.    Hilton Cork Cherrie Franca

## 2017-11-01 NOTE — Anesthesia Postprocedure Evaluation (Signed)
Anesthesia Post Note  Patient: Sylvia Villarreal  Procedure(s) Performed: INTRAMEDULLARY (IM) NAIL INTERTROCHANTRIC (Right Hip)     Patient location during evaluation: PACU Anesthesia Type: General Level of consciousness: awake Pain management: pain level controlled Vital Signs Assessment: post-procedure vital signs reviewed and stable Respiratory status: spontaneous breathing, nonlabored ventilation, respiratory function stable and patient connected to nasal cannula oxygen Cardiovascular status: blood pressure returned to baseline, stable and tachycardic Postop Assessment: no apparent nausea or vomiting Anesthetic complications: no Comments: Patients pulse returned to baseline of 100-110.     Last Vitals:  Vitals:   11/01/17 1930 11/01/17 1945  BP:  (!) 184/94  Pulse:    Resp:    Temp: 36.6 C   SpO2:      Last Pain:  Vitals:   11/01/17 1945  TempSrc:   PainSc: Asleep                 Allie Gerhold S

## 2017-11-01 NOTE — OR Nursing (Signed)
Patient responds to voice. Patient is resting well, is arousable upon calling. Denies pain, ice pack applied to patient right hip.

## 2017-11-01 NOTE — Transfer of Care (Signed)
Immediate Anesthesia Transfer of Care Note  Patient: Sylvia Villarreal  Procedure(s) Performed: Procedure(s): INTRAMEDULLARY (IM) NAIL INTERTROCHANTRIC (Right)  Patient Location: PACU  Anesthesia Type:General  Level of Consciousness:  sedated, patient cooperative and responds to stimulation  Airway & Oxygen Therapy:Patient Spontanous Breathing and Patient connected to face mask oxgen  Post-op Assessment:  Report given to PACU RN and Post -op Vital signs reviewed and stable  Post vital signs:  Reviewed and stable  Last Vitals:  Vitals:   11/01/17 0530 11/01/17 1340  BP: 138/67 136/73  Pulse: (!) 112 (!) 121  Resp: 18 16  Temp: 36.8 C   SpO2: 60% 10%    Complications: No apparent anesthesia complications

## 2017-11-01 NOTE — Anesthesia Procedure Notes (Signed)
Procedure Name: Intubation Date/Time: 11/01/2017 5:47 PM Performed by: Claudia Desanctis, CRNA Pre-anesthesia Checklist: Patient identified, Emergency Drugs available, Suction available and Patient being monitored Patient Re-evaluated:Patient Re-evaluated prior to induction Oxygen Delivery Method: Circle system utilized Preoxygenation: Pre-oxygenation with 100% oxygen Induction Type: IV induction Ventilation: Mask ventilation without difficulty Laryngoscope Size: 2 and Miller Grade View: Grade I Tube type: Oral Tube size: 7.5 mm Number of attempts: 1 Airway Equipment and Method: Stylet Placement Confirmation: ETT inserted through vocal cords under direct vision,  positive ETCO2 and breath sounds checked- equal and bilateral Secured at: 21 cm Tube secured with: Tape Dental Injury: Teeth and Oropharynx as per pre-operative assessment

## 2017-11-02 ENCOUNTER — Encounter (HOSPITAL_COMMUNITY): Payer: Self-pay | Admitting: Orthopedic Surgery

## 2017-11-02 DIAGNOSIS — Z09 Encounter for follow-up examination after completed treatment for conditions other than malignant neoplasm: Secondary | ICD-10-CM

## 2017-11-02 LAB — URINE CULTURE

## 2017-11-02 LAB — CBC
HCT: 31.4 % — ABNORMAL LOW (ref 36.0–46.0)
Hemoglobin: 10.5 g/dL — ABNORMAL LOW (ref 12.0–15.0)
MCH: 31.4 pg (ref 26.0–34.0)
MCHC: 33.4 g/dL (ref 30.0–36.0)
MCV: 94 fL (ref 78.0–100.0)
Platelets: 208 10*3/uL (ref 150–400)
RBC: 3.34 MIL/uL — AB (ref 3.87–5.11)
RDW: 12.8 % (ref 11.5–15.5)
WBC: 14.5 10*3/uL — AB (ref 4.0–10.5)

## 2017-11-02 LAB — BASIC METABOLIC PANEL
ANION GAP: 9 (ref 5–15)
BUN: 21 mg/dL (ref 8–23)
CO2: 25 mmol/L (ref 22–32)
Calcium: 8.7 mg/dL — ABNORMAL LOW (ref 8.9–10.3)
Chloride: 107 mmol/L (ref 98–111)
Creatinine, Ser: 1.16 mg/dL — ABNORMAL HIGH (ref 0.44–1.00)
GFR calc Af Amer: 46 mL/min — ABNORMAL LOW (ref >60)
GFR, EST NON AFRICAN AMERICAN: 40 mL/min — AB (ref >60)
Glucose, Bld: 158 mg/dL — ABNORMAL HIGH (ref 70–99)
Potassium: 4 mmol/L (ref 3.5–5.1)
SODIUM: 141 mmol/L (ref 135–145)

## 2017-11-02 MED ORDER — HYDROCODONE-ACETAMINOPHEN 5-325 MG PO TABS: 1.0000 | ORAL_TABLET | ORAL | 0 refills | Status: AC | PRN

## 2017-11-02 MED ORDER — ENOXAPARIN SODIUM 30 MG/0.3ML ~~LOC~~ SOLN: 30.0000 mg | SUBCUTANEOUS | 0 refills | Status: DC

## 2017-11-02 MED ORDER — LABETALOL HCL 5 MG/ML IV SOLN
10.0000 mg | Freq: Four times a day (QID) | INTRAVENOUS | Status: DC | PRN
  Administered 2017-11-03 – 2017-11-04 (×3): 10 mg via INTRAVENOUS
  Filled 2017-11-02 (×4): qty 4

## 2017-11-02 NOTE — Plan of Care (Signed)
Pt stable though overall weak and tired. Pt continues to be fearful and apprehensive about movement in bed or in chair. Pt also continues to be confused at times with period of sleepiness (though arousable). Rn help po meds due to aspiration concerns due to sleepiness overall. Family was supportive at bedside of patient earlier in shift. No needs or concerns at this time.

## 2017-11-02 NOTE — Progress Notes (Signed)
RN attempted to give Colace, Oxycontin, Senokot-S, and medline mouth rinse. RN attempted to use applesauce to give the medicine, patient held the apple sauce in her mouth and then spit it out. RN attempted again to give the medicine and the patient would not open her mouth.  Carlynn Herald, RN 11/01/2017 2258

## 2017-11-02 NOTE — Plan of Care (Signed)
  Problem: Safety: Goal: Ability to remain free from injury will improve Outcome: Progressing   Problem: Skin Integrity: Goal: Risk for impaired skin integrity will decrease Outcome: Progressing   Carlynn Herald, RN 11/02/17 4:23 AM

## 2017-11-02 NOTE — Progress Notes (Signed)
PROGRESS NOTE  Sylvia Villarreal RCB:[id number] DOB: [birth year] DOA: 10/31/2017 PCP: Dorothyann Peng, NP  HPI/Recap of past 24 hours: Sylvia Villarreal is a 84 y.o. female with medical history significant for HTN, dementia presented to the ER after a fall on her R side, after she lost her balance while trying to stand up from a seated position and trying to grab her walker. Pt denies any chest pain, SOB, abdominal pain, fever/chills, cough. Pt is demented, and doesn't talk much. Xray of R hip showed comminuted intertrochanteric fracture. Ortho consulted. Pt admitted for further management.  11/01/2017: Patient seen and examined with her daughter at bedside.  Daughter patient has had breakthrough pain.  Started on OxyContin 10 mg twice daily as the patient has dementia and not able to ask for as needed pain medications.  Added IV Dilaudid as needed for severe pain.  11/02/2017: Patient seen and examined at her bedside.  She is POD #1 post right hip fracture repair.  She is alert and pleasantly demented.  On pain management with bowel regimen in place.  Assessment/Plan: Principal Problem:   Closed right hip fracture (HCC) Active Problems:   Senile dementia with delusional features [facility])   Essential hypertension   Closed comminuted intertrochanteric fracture of proximal end of right femur (HCC)  Closed right hip fracture secondary to mechanical fall X-ray showed R hip comminuted intertrochanteric fracture Ortho consulted POD #1 post right hip fracture repair Continue OxyContin 10 mg twice daily for moderate pain Continue IV Dilaudid 0.5 mg every 4 hours as needed for severe pain Bowel regimen in place with Senokot 2 tablets twice daily and MiraLAX daily  Acute blood loss anemia post surgery Hemoglobin is 10.5 from baseline 13.2 On chemical DVT prophylaxis Continue to monitor hemoglobin Repeat CBC in the morning  Resolved accelerated hypertension most likely related to her pain IV hydralazine as  needed for hypertension Control pain  Sinus tachycardia possibly related to dehydration Start gentle IV fluid hydration Continue to monitor vital signs  Leukocytosis likely reactive UA chest X chest x-ray unremarkable WBC trending up Obtain procalcitonin in the morning Repeat CBC in the morning  CKD stage III Stable Back to her baseline creatinine 1.16 with GFR of 46 Cr presentation 1.30 Avoid nephrotoxic agents/dehydration/hypotension Obtain BMP in the morning  Dementia Stable, usually quiet and doesn't say much Reorient as needed Fall precautions     Code Status: DNR  Family Communication: Daughter at bedside on 11/01/2017  Disposition Plan: SNF/short-term rehab post surgery when orthopedic surgery signs off   Consultants:  Orthopedic surgery  Procedures:  Post right hip fracture repair  Antimicrobials:  None  DVT prophylaxis: SCDs   Objective: Vitals:   11/02/17 0138 11/02/17 0544 11/02/17 1000 11/02/17 1405  BP: (!) 155/68 (!) 148/72 (!) 152/81 138/63  Pulse: (!) 109 (!) 108 100 (!) 114  Resp: 16 17 18 16   Temp: 98.3 F (36.8 C) 98 F (36.7 C) 98 F (36.7 C) 98.6 F (37 C)  TempSrc: Axillary  Axillary Oral  SpO2: 98% 95% 100% 96%  Weight:      Height:        Intake/Output Summary (Last 24 hours) at 11/02/2017 1726 Last data filed at 11/02/2017 1639 Gross per 24 hour  Intake 2047.75 ml  Output 835 ml  Net 1212.75 ml   Filed Weights   10/31/17 1427 11/01/17 0530 11/01/17 1659  Weight: 57.6 kg 60.2 kg 60.2 kg    Exam:  . General: 84 y.o. year-old  female well-developed well-nourished in no acute distress.  Pleasantly confused. . Cardiovascular: Regular rate and rhythm with no rubs or gallops.  No JVD or thyromegaly noted.   Marland Kitchen Respiratory: Clear to auscultation with no wheezes or rales. Good inspiratory effort. . Abdomen: Soft nontender nondistended with normal bowel sounds x4 quadrants. . Musculoskeletal: Surgical strip noted at the  right hip.  Marland Kitchen Psychiatry: Mood is appropriate for condition and setting   Data Reviewed: CBC: Recent Labs  Lab 10/31/17 1621 11/01/17 0400 11/02/17 0444  WBC 14.4* 12.3* 14.5*  NEUTROABS 12.7*  --   --   HGB 13.2 11.2* 10.5*  HCT 39.1 33.6* 31.4*  MCV 94.4 94.1 94.0  PLT 264 242 672   Basic Metabolic Panel: Recent Labs  Lab 10/31/17 1621 11/01/17 0400 11/02/17 0444  NA 142 141 141  K 3.7 4.2 4.0  CL 105 107 107  CO2 26 25 25   GLUCOSE 156* 131* 158*  BUN 22 24* 21  CREATININE 1.30* 1.26* 1.16*  CALCIUM 8.8* 8.6* 8.7*   GFR: Estimated Creatinine Clearance: 27.3 mL/min (A) (by C-G formula based on SCr of 1.16 mg/dL (H)). Liver Function Tests: No results for input(s): AST, ALT, ALKPHOS, BILITOT, PROT, ALBUMIN in the last 168 hours. No results for input(s): LIPASE, AMYLASE in the last 168 hours. No results for input(s): AMMONIA in the last 168 hours. Coagulation Profile: Recent Labs  Lab 10/31/17 1621  INR 1.00   Cardiac Enzymes: No results for input(s): CKTOTAL, CKMB, CKMBINDEX, TROPONINI in the last 168 hours. BNP (last 3 results) No results for input(s): PROBNP in the last 8760 hours. HbA1C: No results for input(s): HGBA1C in the last 72 hours. CBG: No results for input(s): GLUCAP in the last 168 hours. Lipid Profile: No results for input(s): CHOL, HDL, LDLCALC, TRIG, CHOLHDL, LDLDIRECT in the last 72 hours. Thyroid Function Tests: No results for input(s): TSH, T4TOTAL, FREET4, T3FREE, THYROIDAB in the last 72 hours. Anemia Panel: No results for input(s): VITAMINB12, FOLATE, FERRITIN, TIBC, IRON, RETICCTPCT in the last 72 hours. Urine analysis:    Component Value Date/Time   COLORURINE YELLOW 11/01/2017 0530   APPEARANCEUR CLEAR 11/01/2017 0530   LABSPEC 1.021 11/01/2017 0530   PHURINE 5.0 11/01/2017 0530   GLUCOSEU NEGATIVE 11/01/2017 0530   HGBUR NEGATIVE 11/01/2017 0530   BILIRUBINUR NEGATIVE 11/01/2017 0530   KETONESUR 5 (A) 11/01/2017 0530    PROTEINUR NEGATIVE 11/01/2017 0530   UROBILINOGEN 0.2 01/20/2014 1605   NITRITE NEGATIVE 11/01/2017 0530   LEUKOCYTESUR NEGATIVE 11/01/2017 0530   Sepsis Labs: @LABRCNTIP (procalcitonin:4,lacticidven:4)  ) Recent Results (from the past 240 hour(s))  MRSA PCR Screening     Status: None   Collection Time: 11/01/17  2:00 AM  Result Value Ref Range Status   MRSA by PCR NEGATIVE NEGATIVE Final    Comment:        The GeneXpert MRSA Assay (FDA approved for NASAL specimens only), is one component of a comprehensive MRSA colonization surveillance program. It is not intended to diagnose MRSA infection nor to guide or monitor treatment for MRSA infections. Performed at [facility], [place] [address] [place], [place] [address]   Urine Culture     Status: Abnormal   Collection Time: 11/01/17  5:30 AM  Result Value Ref Range Status   Specimen Description   Final    URINE, CLEAN CATCH Performed at [facility], [place] [address], [place], [place] [address]    Special Requests   Final  NONE Performed at [facility], [place] [address] [place], [place] [address]    Culture MULTIPLE SPECIES PRESENT, SUGGEST RECOLLECTION (A)  Final   Report Status 11/02/2017 FINAL  Final      Studies: Pelvis Portable  Result Date: 11/01/2017 CLINICAL DATA:  Right hip fracture EXAM: PORTABLE PELVIS 1-2 VIEWS COMPARISON:  01/21/2014, 10/31/2017 FINDINGS: Intramedullary rod and distal screw fixation of the right femur across intertrochanteric fracture. Displaced lesser trochanteric fracture fragment. Right femoral head projects in joint. Gas within the soft tissues. IMPRESSION: Interval surgical fixation of comminuted right intertrochanteric fracture with expected surgical changes Electronically Signed   By: Donavan Foil M.D.   On: 11/01/2017 20:00   Dg C-arm 1-60 Min-no Report  Result Date: 11/01/2017 Fluoroscopy was utilized by the requesting physician.   No radiographic interpretation.   Dg Hip Operative Unilat W Or W/o Pelvis Right  Result Date: 11/01/2017 CLINICAL DATA:  Right hip IM nail EXAM: OPERATIVE RIGHT HIP (WITH PELVIS IF PERFORMED) 2 VIEWS TECHNIQUE: Fluoroscopic spot image(s) were submitted for interpretation post-operatively. COMPARISON:  10/31/2017 FINDINGS: Two intraoperative spot images demonstrate placement of IM nail and hip screw across the right femoral intertrochanteric fracture. Anatomic alignment. No hardware complicating feature. IMPRESSION: Internal fixation of the right intertrochanteric fracture with anatomic alignment. Electronically Signed   By: Rolm Baptise M.D.   On: 11/01/2017 19:22    Scheduled Meds: . docusate sodium  100 mg Oral BID  . enoxaparin (LOVENOX) injection  30 mg Subcutaneous Q24H  . mouth rinse  15 mL Mouth Rinse BID  . oxyCODONE  10 mg Oral Q12H  . senna-docusate  2 tablet Oral BID    Continuous Infusions: . lactated ringers 50 mL/hr at 11/02/17 1400     LOS: 2 days     Kayleen Memos, MD [facility] Pager [phone]  If 7PM-7AM, please contact night-coverage [email or web address] Password [facility] 11/02/2017, 5:26 PM

## 2017-11-02 NOTE — Progress Notes (Signed)
    Subjective:  Patient reports pain as mild to moderate.  Denies N/V/CP/SOB. No c/o.  Objective:   VITALS:   Vitals:   11/02/17 0011 11/02/17 0138 11/02/17 0544 11/02/17 1000  BP: (!) 147/50 (!) 155/68 (!) 148/72 (!) 152/81  Pulse: (!) 113 (!) 109 (!) 108 100  Resp: 20 16 17 18   Temp: 98.4 F (36.9 C) 98.3 F (36.8 C) 98 F (36.7 C) 98 F (36.7 C)  TempSrc: Axillary Axillary  Axillary  SpO2: 98% 98% 95% 100%  Weight:      Height:        NAD ABD soft Sensation intact distally Intact pulses distally Dorsiflexion/Plantar flexion intact Incision: dressing C/D/I Compartment soft   Lab Results  Component Value Date   WBC 14.5 (H) 11/02/2017   HGB 10.5 (L) 11/02/2017   HCT 31.4 (L) 11/02/2017   MCV 94.0 11/02/2017   PLT 208 11/02/2017   BMET    Component Value Date/Time   NA 141 11/02/2017 0444   K 4.0 11/02/2017 0444   CL 107 11/02/2017 0444   CO2 25 11/02/2017 0444   GLUCOSE 158 (H) 11/02/2017 0444   BUN 21 11/02/2017 0444   CREATININE 1.16 (H) 11/02/2017 0444   CALCIUM 8.7 (L) 11/02/2017 0444   GFRNONAA 40 (L) 11/02/2017 0444   GFRAA 46 (L) 11/02/2017 0444     Assessment/Plan: 1 Day Post-Op   Principal Problem:   Closed right hip fracture (HCC) Active Problems:   Senile dementia with delusional features (HCC)   Essential hypertension   Closed comminuted intertrochanteric fracture of proximal end of right femur ([place])   WBAT with walker DVT ppx: Lovenox, SCDs, TEDS PO pain control PT/OT Dispo: D/C home with HHPT, already has 24 hr RN assistance   Hilton Cork Elishah Ashmore 11/02/2017, 12:07 PM   Rod Can, MD Cell [phone]

## 2017-11-02 NOTE — Evaluation (Addendum)
Physical Therapy Evaluation Patient Details Name: Sylvia Villarreal MRN: [id number] DOB: November 23, 1924 Today's Date: 11/02/2017   History of Present Illness  84 yo female admitted 10/31/17 after fall 10/31/17 and sustained Right intertrochanteric femur fracture. Post op right intramedullary fixation on 11/01/17. History of dementia  Clinical Impression  The patient is cheerful at times. Patient's conversation not sensible but is able to participate in mobility. Pt admitted with above diagnosis. Pt currently with functional limitations due to the deficits listed below (see PT Problem List).  Pt will benefit from skilled PT to increase their independence and safety with mobility to allow discharge to the venue listed below.       Follow Up Recommendations SNF    Equipment Recommendations  None recommended by PT    Recommendations for Other Services       Precautions / Restrictions Precautions Precautions: Fall Restrictions RLE Weight Bearing: Weight bearing as tolerated      Mobility  Bed Mobility Overal bed mobility: Needs Assistance Bed Mobility: Supine to Sit     Supine to sit: Total assist;+2 for physical assistance;+2 for safety/equipment;HOB elevated     General bed mobility comments: uised bed pad and moved both legs to turn to sit at bed edge  Transfers Overall transfer level: Needs assistance Equipment used: Rolling walker (2 wheeled) Transfers: Sit to/from [facility] Sit to Stand: Max assist;+2 physical assistance;+2 safety/equipment Stand pivot transfers: +2 physical assistance;Max assist;+2 safety/equipment       General transfer comment: Assisted to power up from bed to stand  x 2 at Rw with patient unable to bear weight, trunk flexed forward.. Unable to pivot to recliner with RW. Assisted to stand in [place] stedy to transfer to recliner.  Ambulation/Gait                Stairs            Wheelchair Mobility    Modified Rankin (Stroke  Patients Only)       Balance Overall balance assessment: Needs assistance;History of Falls Sitting-balance support: Bilateral upper extremity supported;Feet supported Sitting balance-Leahy Scale: Fair     Standing balance support: During functional activity;Bilateral upper extremity supported Standing balance-Leahy Scale: Zero                               Pertinent Vitals/Pain Pain Assessment: Faces Faces Pain Scale: Hurts even more Pain Location: right hip and leg, especially when knee is flexxed Pain Descriptors / Indicators: Grimacing;Guarding;Moaning Pain Intervention(s): Premedicated before session;Limited activity within patient's tolerance;Monitored during session;Ice applied;Repositioned    Home Living Family/patient expects to be discharged to:: Skilled nursing facility                      Prior Function                 Hand Dominance        Extremity/Trunk Assessment   Upper Extremity Assessment Upper Extremity Assessment: (P) Difficult to assess due to impaired cognition    Lower Extremity Assessment Lower Extremity Assessment: RLE deficits/detail RLE Deficits / Details: decreased weight bearing tolerated for standing    Cervical / Trunk Assessment Cervical / Trunk Assessment: Kyphotic  Communication   Communication: No difficulties  Cognition Arousal/Alertness: Awake/alert Behavior During Therapy: WFL for tasks assessed/performed Overall Cognitive Status: History of cognitive impairments - at baseline  General Comments      Exercises     Assessment/Plan    PT Assessment Patient needs continued PT services  PT Problem List Decreased strength;Decreased cognition;Decreased range of motion;Decreased knowledge of use of DME;Decreased activity tolerance;Decreased safety awareness;Decreased knowledge of precautions;Decreased balance;Decreased mobility;Pain       PT  Treatment Interventions DME instruction;Gait training;Functional mobility training;Therapeutic activities;Patient/family education;Therapeutic exercise    PT Goals (Current goals can be found in the Care Plan section)  Acute Rehab PT Goals Patient Stated Goal: per family to go to rehab PT Goal Formulation: With family Time For Goal Achievement: 11/16/17 Potential to Achieve Goals: Fair    Frequency Min 2X/week(may need more  for ins.)   Barriers to discharge        Co-evaluation PT/OT/SLP Co-Evaluation/Treatment: Yes Reason for Co-Treatment: For patient/therapist safety PT goals addressed during session: Mobility/safety with mobility OT goals addressed during session: ADL's and self-care       AM-PAC PT "6 Clicks" Daily Activity  Outcome Measure Difficulty turning over in bed (including adjusting bedclothes, sheets and blankets)?: Unable Difficulty moving from lying on back to sitting on the side of the bed? : Unable Difficulty sitting down on and standing up from a chair with arms (e.g., wheelchair, bedside commode, etc,.)?: Unable Help needed moving to and from a bed to chair (including a wheelchair)?: Total Help needed walking in hospital room?: Total Help needed climbing 3-5 steps with a railing? : Total 6 Click Score: 6    End of Session Equipment Utilized During Treatment: Gait belt Activity Tolerance: Patient tolerated treatment well Patient left: in chair;with call bell/phone within reach;with chair alarm set;with family/visitor present Nurse Communication: Mobility status PT Visit Diagnosis: Unsteadiness on feet (R26.81);Pain;History of falling (Z91.81) Pain - Right/Left: Right Pain - part of body: Hip    Time: 1152-1222 PT Time Calculation (min) (ACUTE ONLY): 30 min   Charges:   PT Evaluation $PT Eval Low Complexity: [place] [facility] Acute Rehabilitation Services Pager [phone] Office [phone]    Claretha Cooper 11/02/2017, 1:05 PM

## 2017-11-02 NOTE — Evaluation (Signed)
Occupational Therapy Evaluation Patient Details Name: Sylvia Villarreal MRN: [id number] DOB: [birth year] Today's Date: 11/02/2017    History of Present Illness 84 yo female admitted 10/31/17 after fall 10/31/17 and sustained Right intertrochanteric femur fracture. Post op right intramedullary fixation on 11/01/17   Clinical Impression   Pt was admitted for the above.  She has extensive family assistance for adls at baseline and used RW to get to toilet, but was trying to sit prematurely. Will follow in acute setting with  Mod A level goals around toileting and standing for adls.      Follow Up Recommendations  SNF    Equipment Recommendations  3 in 1 bedside commode    Recommendations for Other Services       Precautions / Restrictions Precautions Precautions: Fall Restrictions RLE Weight Bearing: Weight bearing as tolerated      Mobility Bed Mobility Overal bed mobility: Needs Assistance Bed Mobility: Supine to Sit     Supine to sit: Total assist;+2 for physical assistance;+2 for safety/equipment;HOB elevated     General bed mobility comments: uised bed pad and moved both legs to turn to sit at bed edge  Transfers Overall transfer level: Needs assistance Equipment used: Rolling walker (2 wheeled) Transfers: Sit to/from [facility] Sit to Stand: Max assist;+2 physical assistance;+2 safety/equipment Stand pivot transfers: +2 physical assistance;Max assist;+2 safety/equipment       General transfer comment: Assisted to power up from bed to stand  x 2 at Rw with patient unable to bear weight, trunk flexed forward.  Sat prematurely twice:  Used sara stedy for safety.  Unable to pivot to recliner with RW. Assisted to stand in [place] stedy to transfer to recliner.    Balance Overall balance assessment: Needs assistance;History of Falls Sitting-balance support: Bilateral upper extremity supported;Feet supported Sitting balance-Leahy Scale: Fair     Standing balance  support: During functional activity;Bilateral upper extremity supported Standing balance-Leahy Scale: Zero                             ADL either performed or assessed with clinical judgement   ADL Overall ADL's : Needs assistance/impaired                                       General ADL Comments: at baseline, pt has assistance for all adls:  mostly max/total at this time. Pt was getting up with RW and daughter reports that she did not stand erect and tried to sit prematurely. She did this with therapy.  Stood 3 x's with max +2 assistance. Used sara steady for [employer] operational [employer]      Pertinent Vitals/Pain Pain Assessment: Faces Faces Pain Scale: Hurts even more Pain Location: right hip and leg, especially when knee is flexxed Pain Descriptors / Indicators: Grimacing;Guarding;Moaning Pain Intervention(s): Premedicated before session;Limited activity within patient's tolerance;Monitored during session;Ice applied;Repositioned     Hand Dominance     Extremity/Trunk Assessment Upper Extremity Assessment Upper Extremity Assessment: Difficult to assess due to impaired cognition   Lower Extremity Assessment Lower Extremity Assessment: RLE deficits/detail RLE Deficits / Details: decreased weight bearing tolerated for standing   Cervical / Trunk Assessment Cervical / Trunk Assessment: Kyphotic   Communication Communication Communication: No difficulties   Cognition Arousal/Alertness:  Awake/alert Behavior During Therapy: WFL for tasks assessed/performed Overall Cognitive Status: History of cognitive impairments - at baseline                                     General Comments       Exercises     Shoulder Instructions      Home Living Family/patient expects to be discharged to:: Skilled nursing facility                                        Prior Functioning/Environment  Level of Independence: Needs assistance    ADL's / Homemaking Assistance Needed: a lot of assistance for adls.  assist with Rw to get to bathroom            OT Problem List: Decreased strength;Decreased activity tolerance;Impaired balance (sitting and/or standing);Decreased cognition;Decreased knowledge of use of DME or AE;Pain      OT Treatment/Interventions: Self-care/ADL training;Therapeutic exercise;DME and/or AE instruction;Therapeutic activities;Cognitive remediation/compensation;Balance training;Patient/family education    OT Goals(Current goals can be found in the care plan section) Acute Rehab OT Goals Patient Stated Goal: per family to go to rehab OT Goal Formulation: With family Time For Goal Achievement: 11/16/17 Potential to Achieve Goals: Good ADL Goals Pt Will Transfer to Toilet: with mod assist;with +2 assist;bedside commode;stand pivot transfer Additional ADL Goal #1: pt will go from sit to stand with mod A and maintain for one minute with mod A for adls  OT Frequency: Min 2X/week   Barriers to D/C:            Co-evaluation   Reason for Co-Treatment: For patient/therapist safety PT goals addressed during session: Mobility/safety with mobility OT goals addressed during session: ADL's and self-care      AM-PAC PT "6 Clicks" Daily Activity     Outcome Measure Help from another person eating meals?: A Lot Help from another person taking care of personal grooming?: A Lot Help from another person toileting, which includes using toliet, bedpan, or urinal?: A Lot Help from another person bathing (including washing, rinsing, drying)?: Total Help from another person to put on and taking off regular upper body clothing?: Total Help from another person to put on and taking off regular lower body clothing?: Total 6 Click Score: 9   End of Session Nurse Communication: Mobility status;Need for lift equipment(sara stedy)  Activity Tolerance: Patient limited by  fatigue Patient left: in chair;with call bell/phone within reach;with chair alarm set;with family/visitor present  OT Visit Diagnosis: Pain Pain - Right/Left: Left Pain - part of body: Hip                Time: 8099-8338 OT Time Calculation (min): 29 min Charges:  OT General Charges $OT Visit: 1 Visit OT Evaluation $OT Eval Moderate Complexity: 1 [address], OTR/L [phone] 11/02/2017  Kenita Bines 11/02/2017, 1:09 PM

## 2017-11-02 NOTE — Progress Notes (Signed)
Physical Therapy Treatment Patient Details Name: Sylvia Villarreal MRN: [id number] DOB: 26-Jul-1924 Today's Date: 11/02/2017    History of Present Illness 84 yo female admitted 10/31/17 after fall 10/31/17 and sustained Right intertrochanteric femur fracture. Post op right intramedullary fixation on 11/01/17    PT Comments    RN sought out PT to assist pt back to bed. Pt required max-total assist for squat pivot back to bed, and pt at times resisting PT physical assist. When calmed by granddaughter and daughter at bedside, pt with less resisting during mobility. Pt with anxiety and R hip pain, limiting mobility today. PT to progress mobility as able. Will continue to follow acutely. SNF remains appropriate d/c plan at this time.     Follow Up Recommendations  SNF     Equipment Recommendations  None recommended by PT    Recommendations for Other Services       Precautions / Restrictions Precautions Precautions: None Restrictions Weight Bearing Restrictions: No RLE Weight Bearing: Weight bearing as tolerated    Mobility  Bed Mobility Overal bed mobility: Needs Assistance Bed Mobility: Sit to Supine       Sit to supine: Total assist;+2 for physical assistance   General bed mobility comments: up in chair upon arrival to room, RN requesting pt back to bed. sit to supine total assist +2 for LE management, trunk lowering, scooting up in bed with bed pad.   Transfers Overall transfer level: Needs assistance   Transfers: Squat Pivot Transfers     Squat pivot transfers: Total assist;+2 physical assistance     General transfer comment: assist for scooting in recliner, pivoting into bed, and scooting back in bed to be off the EOB. Pt with painful moaning during transfer, and with some resistance to squat pivot transfer experienced by PT.   Ambulation/Gait                 Stairs             Wheelchair Mobility    Modified Rankin (Stroke Patients Only)        Balance Overall balance assessment: Needs assistance;History of Falls Sitting-balance support: Bilateral upper extremity supported;Feet supported Sitting balance-Leahy Scale: Fair     Standing balance support: During functional activity Standing balance-Leahy Scale: Zero Standing balance comment: Total A for squat pivot to bed                            Cognition Arousal/Alertness: Awake/alert Behavior During Therapy: WFL for tasks assessed/performed Overall Cognitive Status: History of cognitive impairments - at baseline                                        Exercises      General Comments        Pertinent Vitals/Pain Pain Assessment: Faces Faces Pain Scale: Hurts even more Pain Location: RLE, during mobility Pain Descriptors / Indicators: Grimacing;Guarding;Moaning Pain Intervention(s): Monitored during session;Limited activity within patient's tolerance;Repositioned    Home Living                      Prior Function            PT Goals (current goals can now be found in the care plan section) Acute Rehab PT Goals Patient Stated Goal: per family to go to rehab Time For Goal  Achievement: 11/16/17 Potential to Achieve Goals: Fair Progress towards PT goals: Progressing toward goals    Frequency    Min 2X/week(may need more  for ins.)      PT Plan Current plan remains appropriate    Co-evaluation              AM-PAC PT "6 Clicks" Daily Activity  Outcome Measure  Difficulty turning over in bed (including adjusting bedclothes, sheets and blankets)?: Unable Difficulty moving from lying on back to sitting on the side of the bed? : Unable Difficulty sitting down on and standing up from a chair with arms (e.g., wheelchair, bedside commode, etc,.)?: Unable Help needed moving to and from a bed to chair (including a wheelchair)?: Total Help needed walking in hospital room?: Total Help needed climbing 3-5 steps with a  railing? : Total 6 Click Score: 6    End of Session Equipment Utilized During Treatment: Gait belt Activity Tolerance: Patient tolerated treatment well;Patient limited by pain Patient left: with call bell/phone within reach;with family/visitor present;in bed;with bed alarm set Nurse Communication: Mobility status PT Visit Diagnosis: Unsteadiness on feet (R26.81);Pain;History of falling (Z91.81) Pain - Right/Left: Right Pain - part of body: Hip     Time: 8478-4128 PT Time Calculation (min) (ACUTE ONLY): 15 min  Charges:  $Therapeutic Activity: 8-22 mins                     Kassiah Mccrory Conception Chancy, PT, DPT  Pager # [phone]    Tenya Araque D Ilana Prezioso 11/02/2017, 7:33 PM

## 2017-11-03 LAB — BASIC METABOLIC PANEL
Anion gap: 9 (ref 5–15)
BUN: 22 mg/dL (ref 8–23)
CALCIUM: 8.2 mg/dL — AB (ref 8.9–10.3)
CO2: 25 mmol/L (ref 22–32)
Chloride: 107 mmol/L (ref 98–111)
Creatinine, Ser: 1.29 mg/dL — ABNORMAL HIGH (ref 0.44–1.00)
GFR, EST AFRICAN AMERICAN: 41 mL/min — AB (ref >60)
GFR, EST NON AFRICAN AMERICAN: 35 mL/min — AB (ref >60)
GLUCOSE: 106 mg/dL — AB (ref 70–99)
POTASSIUM: 4.1 mmol/L (ref 3.5–5.1)
Sodium: 141 mmol/L (ref 135–145)

## 2017-11-03 LAB — CBC
HEMATOCRIT: 26.6 % — AB (ref 36.0–46.0)
Hemoglobin: 8.9 g/dL — ABNORMAL LOW (ref 12.0–15.0)
MCH: 31.9 pg (ref 26.0–34.0)
MCHC: 33.5 g/dL (ref 30.0–36.0)
MCV: 95.3 fL (ref 78.0–100.0)
PLATELETS: 201 10*3/uL (ref 150–400)
RBC: 2.79 MIL/uL — AB (ref 3.87–5.11)
RDW: 12.9 % (ref 11.5–15.5)
WBC: 12.4 10*3/uL — AB (ref 4.0–10.5)

## 2017-11-03 MED ORDER — SODIUM CHLORIDE 0.9 % IV BOLUS
500.0000 mL | Freq: Once | INTRAVENOUS | Status: AC
  Administered 2017-11-03: 500 mL via INTRAVENOUS

## 2017-11-03 NOTE — Plan of Care (Signed)
Plan of care discussed with patient 

## 2017-11-03 NOTE — NC FL2 (Addendum)
[place] LEVEL OF CARE SCREENING TOOL     IDENTIFICATION  Patient Name: Sylvia Villarreal Birthdate: 04/25/1924 Sex: female Admission Date (Current Location): 10/31/2017  [facility] and [place] Number:  Herbalist and Address:  [facility],  [place], [place]      Provider Number: [id number]  Attending Physician Name and Address:  Kayleen Memos, DO  Relative Name and Phone Number:       Current Level of Care: Hospital Recommended Level of Care: [place] Prior Approval Number:    Date Approved/Denied:   PASRR Number: [id number]  Discharge Plan:      Current Diagnoses: Patient Active Problem List   Diagnosis Date Noted  . Closed comminuted intertrochanteric fracture of proximal end of right femur ([place]) 11/01/2017  . Closed right hip fracture ([place]) 10/31/2017  . Closed fracture of multiple pubic rami ([place]) 01/22/2014  . Cerebral thrombosis with cerebral infarction ([place]) 01/21/2014  . Stroke-like symptoms 01/20/2014  . Left leg weakness 01/20/2014  . Routine health maintenance 08/05/2012  . Senile dementia with delusional features ([place]) 01/13/2010  . ADENOCARCINOMA, BREAST, HX OF 10/09/2008  . [place], MILD 03/23/2007  . Essential hypertension 03/23/2007  . ALLERGIC RHINITIS 03/23/2007  . LEG CRAMPS, NOCTURNAL 03/23/2007  . OSTEOPOROSIS 03/23/2007  . FRACTURE, WRIST, LEFT 03/23/2007  . APPENDECTOMY, HX OF 03/23/2007    Orientation RESPIRATION BLADDER Height & Weight     (Memory Impairment/Responds to Voice)  Normal Incontinent Weight: 132 lb 11.5 oz (60.2 kg) Height:  5\' 4"  (162.6 cm)  BEHAVIORAL SYMPTOMS/MOOD NEUROLOGICAL BOWEL NUTRITION STATUS      Continent Diet(Regular Diet. )  AMBULATORY STATUS COMMUNICATION OF NEEDS Skin   Extensive Assist Verbally Normal                       Personal Care Assistance Level of Assistance  Bathing, Feeding, Dressing Bathing Assistance: Maximum  assistance Feeding assistance: Maximum assistance Dressing Assistance: Maximum assistance     Functional Limitations Info  Sight, Hearing, Speech Sight Info: Adequate Hearing Info: Adequate Speech Info: Adequate    SPECIAL CARE FACTORS FREQUENCY  PT (By licensed PT), OT (By licensed OT)     PT Frequency: 5x/week OT Frequency: 5x/week            Contractures Contractures Info: Not present    Additional Factors Info  Code Status, Allergies, Psychotropic Code Status Info: DNR Allergies Info: Allergies: Sulfonamide Derivatives           Current Medications (11/03/2017):  This is the current hospital active medication list Current Facility-Administered Medications  Medication Dose Route Frequency Provider Last Rate Last Dose  . acetaminophen (TYLENOL) tablet 650 mg  650 mg Oral Q6H PRN Swinteck, Aaron Edelman, MD       Or  . acetaminophen (TYLENOL) suppository 650 mg  650 mg Rectal Q6H PRN Swinteck, Aaron Edelman, MD      . docusate sodium (COLACE) capsule 100 mg  100 mg Oral BID Swinteck, Aaron Edelman, MD      . enoxaparin (LOVENOX) injection 30 mg  30 mg Subcutaneous Q24H Rod Can, MD   30 mg at 11/03/17 0825  . HYDROcodone-acetaminophen (NORCO/VICODIN) 5-325 MG per tablet 1-2 tablet  1-2 tablet Oral Q4H PRN Rod Can, MD   1 tablet at 11/03/17 0817  . HYDROmorphone (DILAUDID) injection 0.5 mg  0.5 mg Intravenous Q4H PRN Rod Can, MD   0.5 mg at 11/03/17 0535  . labetalol (NORMODYNE,TRANDATE)  injection 10 mg  10 mg Intravenous Q6H PRN Irene Pap N, DO   10 mg at 11/03/17 8003  . lactated ringers infusion   Intravenous Continuous Kayleen Memos, DO 75 mL/hr at 11/03/17 1236    . MEDLINE mouth rinse  15 mL Mouth Rinse BID Irene Pap N, DO      . menthol-cetylpyridinium (CEPACOL) lozenge 3 mg  1 lozenge Oral PRN Swinteck, Aaron Edelman, MD       Or  . phenol (CHLORASEPTIC) mouth spray 1 spray  1 spray Mouth/Throat PRN Swinteck, Aaron Edelman, MD      . metoCLOPramide (REGLAN) tablet 5-10  mg  5-10 mg Oral Q8H PRN Swinteck, Aaron Edelman, MD       Or  . metoCLOPramide (REGLAN) injection 5-10 mg  5-10 mg Intravenous Q8H PRN Swinteck, Aaron Edelman, MD      . ondansetron [facility]) tablet 4 mg  4 mg Oral Q6H PRN Swinteck, Aaron Edelman, MD       Or  . ondansetron (ZOFRAN) injection 4 mg  4 mg Intravenous Q6H PRN Rod Can, MD   4 mg at 11/01/17 1818  . oxyCODONE (OXYCONTIN) 12 hr tablet 10 mg  10 mg Oral Q12H Rod Can, MD   10 mg at 11/03/17 1009  . senna-docusate (Senokot-S) tablet 2 tablet  2 tablet Oral BID Rod Can, MD   2 tablet at 11/03/17 1010     Discharge Medications: Please see discharge summary for a list of discharge medications.  Relevant Imaging Results:  Relevant Lab Results:   Additional Information ssn:[id number]  Lia Hopping, LCSW

## 2017-11-03 NOTE — Progress Notes (Signed)
Physical Therapy Treatment Patient Details Name: Sylvia Villarreal MRN: [id number] DOB: [birth year] Today's Date: 11/03/2017    History of Present Illness 84 yo female admitted 10/31/17 after fall 10/31/17 and sustained Right intertrochanteric femur fracture. Post op right intramedullary fixation on 11/01/17    PT Comments    Pt more alert and oriented this session, stating her name to PT and talking about her broken hip. Pt tolerated standing in steady with seat support for approximately 5 minutes today. Pt appears to still be in a lot of R hip pain today, moaning and grimacing with mobility. Will continue to follow acutely.     Follow Up Recommendations  SNF;Supervision for mobility/OOB     Equipment Recommendations  None recommended by PT    Recommendations for Other Services       Precautions / Restrictions Precautions Precautions: Fall Restrictions Weight Bearing Restrictions: No RLE Weight Bearing: Weight bearing as tolerated    Mobility  Bed Mobility Overal bed mobility: Needs Assistance Bed Mobility: Supine to Sit     Supine to sit: Total assist;+2 for physical assistance;HOB elevated     General bed mobility comments: utilized bed pad during supine>sit. +2 for trunk management, LE mangement, and scooting to EOB.   Transfers Overall transfer level: Needs assistance Equipment used: Rolling walker (2 wheeled) Transfers: Sit to/from [facility] Sit to Stand: Max assist;+2 physical assistance Stand pivot transfers: +2 physical assistance;Max assist       General transfer comment: Physical assist for power up, hand placement on steady, and hip extension. Pt resistant to hip extension facilitation. Pt participated in static standing x5 minutes while resting against steady seat. Verbal and tactile cuing to lift chest with some success. Pt transferred via steady to recliner, and when moving the steady seat away, pt states "don't let me fall on this floor". Pt  safely in chair, PT and PT tech assist to scoot pt back in recliner via bed pad.   Ambulation/Gait                 Stairs             Wheelchair Mobility    Modified Rankin (Stroke Patients Only)       Balance Overall balance assessment: Needs assistance;History of Falls Sitting-balance support: Bilateral upper extremity supported;Feet supported Sitting balance-Leahy Scale: Fair Sitting balance - Comments: Pt facilitated UE propping during session    Standing balance support: During functional activity Standing balance-Leahy Scale: Zero Standing balance comment: max to total assist for standing, relies on support of steady and PT                            Cognition Arousal/Alertness: Awake/alert Behavior During Therapy: WFL for tasks assessed/performed Overall Cognitive Status: History of cognitive impairments - at baseline                                 General Comments: Pt stating this session "I broke my hip", "My hip and leg hurts, don't move that leg". Pt appears more aware today.       Exercises Other Exercises Other Exercises: Knee flexion/extension PROM, 10 reps R, seated    General Comments        Pertinent Vitals/Pain Pain Assessment: Faces Faces Pain Scale: Hurts even more Pain Location: RLE, during mobility Pain Descriptors / Indicators: Grimacing;Guarding;Moaning Pain Intervention(s): Limited activity  within patient's tolerance;Repositioned;Monitored during session;Premedicated before session    Home Living                      Prior Function            PT Goals (current goals can now be found in the care plan section) Acute Rehab PT Goals Patient Stated Goal: per family to go to rehab PT Goal Formulation: With patient/family Time For Goal Achievement: 11/16/17 Potential to Achieve Goals: Fair Progress towards PT goals: Progressing toward goals    Frequency    Min 2X/week(may need more   for ins.)      PT Plan Current plan remains appropriate    Co-evaluation              AM-PAC PT "6 Clicks" Daily Activity  Outcome Measure  Difficulty turning over in bed (including adjusting bedclothes, sheets and blankets)?: Unable Difficulty moving from lying on back to sitting on the side of the bed? : Unable Difficulty sitting down on and standing up from a chair with arms (e.g., wheelchair, bedside commode, etc,.)?: Unable Help needed moving to and from a bed to chair (including a wheelchair)?: Total Help needed walking in hospital room?: Total Help needed climbing 3-5 steps with a railing? : Total 6 Click Score: 6    End of Session Equipment Utilized During Treatment: Gait belt Activity Tolerance: Patient tolerated treatment well;Patient limited by pain Patient left: with call bell/phone within reach;with family/visitor present;in bed;with bed alarm set   PT Visit Diagnosis: Unsteadiness on feet (R26.81);Pain;History of falling (Z91.81) Pain - Right/Left: Right Pain - part of body: Hip     Time: 3300-7622 PT Time Calculation (min) (ACUTE ONLY): 34 min  Charges:  $Therapeutic Activity: 23-37 mins                     Joselinne Lawal Conception Chancy, PT, DPT  Pager # [phone]    Zayed Griffie D Chevie Birkhead 11/03/2017, 2:40 PM

## 2017-11-03 NOTE — Progress Notes (Signed)
PROGRESS NOTE  Sylvia Villarreal NOM:[id number] DOB: 03/02/1924 DOA: 10/31/2017 PCP: Dorothyann Peng, NP  HPI/Recap of past 24 hours: Sylvia Villarreal is a 84 y.o. female with medical history significant for HTN, dementia presented to the ER after a fall on her R side, after she lost her balance while trying to stand up from a seated position and trying to grab her walker. Pt denies any chest pain, SOB, abdominal pain, fever/chills, cough. Pt is demented, and doesn't talk much. Xray of R hip showed comminuted intertrochanteric fracture. Ortho consulted. Pt admitted for further management.  -11/01/2017: POD #0 post right hip fracture repair.   11/03/2017: Patient seen and examined with daughter at bedside.  She does not appear to be in distress.  She is alert and pleasantly demented.  Denies pain after taking her pain medications.  Assessment/Plan: Principal Problem:   Closed right hip fracture (HCC) Active Problems:   Senile dementia with delusional features [facility])   Essential hypertension   Closed comminuted intertrochanteric fracture of proximal end of right femur (HCC)  Closed right hip fracture secondary to mechanical fall X-ray showed R hip comminuted intertrochanteric fracture POD #2 post right hip fracture repair Pain management in place including as needed IV Dilaudid 0.5 mg every 4 hours for severe pain Bowel regimen in place with Senokot 2 tablets twice daily and MiraLAX twice daily Physical therapy as recommended by orthopedic surgery  Acute blood loss anemia post surgery Hemoglobin is 10.5 from baseline 13.2 Hemoglobin dropped from 10.5-8.9 today Possibly dilutional on IV fluid Repeat CBC in the morning  Resolved accelerated hypertension most likely related to her pain IV hydralazine as needed for hypertension Control pain  Sinus tachycardia possibly related to dehydration Improving Decreased rate of IV fluid to 50 cc/h normal saline  Leukocytosis, improving likely  reactive UA chest X chest x-ray unremarkable WBC trending down from 14 K to 12 K today Repeat CBC in the morning  CKD stage III Mild rising creatinine from 1.16-1.29 with GFR of 41 from 46 Continue to avoid nephrotoxic agents Obtain BMP in the morning  Dementia At baseline usually quiet and doesn't say much Reorient as needed Fall precautions     Code Status: DNR  Family Communication: Daughter at bedside on 11/01/2017 and 11/03/2017  Disposition Plan: SNF/short-term rehab post surgery when orthopedic surgery signs off   Consultants:  Orthopedic surgery  CSW  Procedures:  Post right hip fracture repair  Antimicrobials:  None  DVT prophylaxis: SCDs   Objective: Vitals:   11/03/17 0601 11/03/17 0601 11/03/17 0724 11/03/17 1357  BP: (!) 160/76 (!) 160/76 (!) 147/76 (!) 142/74  Pulse: (!) 117 (!) 115 (!) 104 (!) 101  Resp: 13 13  17   Temp: 99.2 F (37.3 C) 99.2 F (37.3 C)  99.1 F (37.3 C)  TempSrc: Axillary Axillary  Oral  SpO2: 93% 90% 95% 95%  Weight:      Height:        Intake/Output Summary (Last 24 hours) at 11/03/2017 1746 Last data filed at 11/03/2017 1742 Gross per 24 hour  Intake 1662.61 ml  Output 200 ml  Net 1462.61 ml   Filed Weights   10/31/17 1427 11/01/17 0530 11/01/17 1659  Weight: 57.6 kg 60.2 kg 60.2 kg    Exam:  . General: 84 y.o. year-old female pleasantly confused in no acute distress.  Well-developed well-nourished.  Alert in the setting of advanced dementia. . Cardiovascular: Regular rate and rhythm with no rubs or gallops.  No  JVD or thyromegaly noted. Marland Kitchen Respiratory: Clear to auscultation with no wheezes or rales. Good inspiratory effort. . Abdomen: Soft nontender nondistended with normal bowel sounds x4 quadrants. . Musculoskeletal: Surgical strip noted at the right hip.  Marland Kitchen Psychiatry: Mood is appropriate for condition and setting   Data Reviewed: CBC: Recent Labs  Lab 10/31/17 1621 11/01/17 0400 11/02/17 0444  11/03/17 0800  WBC 14.4* 12.3* 14.5* 12.4*  NEUTROABS 12.7*  --   --   --   HGB 13.2 11.2* 10.5* 8.9*  HCT 39.1 33.6* 31.4* 26.6*  MCV 94.4 94.1 94.0 95.3  PLT 264 242 208 211   Basic Metabolic Panel: Recent Labs  Lab 10/31/17 1621 11/01/17 0400 11/02/17 0444 11/03/17 0800  NA 142 141 141 141  K 3.7 4.2 4.0 4.1  CL 105 107 107 107  CO2 26 25 25 25   GLUCOSE 156* 131* 158* 106*  BUN 22 24* 21 22  CREATININE 1.30* 1.26* 1.16* 1.29*  CALCIUM 8.8* 8.6* 8.7* 8.2*   GFR: Estimated Creatinine Clearance: 24.5 mL/min (A) (by C-G formula based on SCr of 1.29 mg/dL (H)). Liver Function Tests: No results for input(s): AST, ALT, ALKPHOS, BILITOT, PROT, ALBUMIN in the last 168 hours. No results for input(s): LIPASE, AMYLASE in the last 168 hours. No results for input(s): AMMONIA in the last 168 hours. Coagulation Profile: Recent Labs  Lab 10/31/17 1621  INR 1.00   Cardiac Enzymes: No results for input(s): CKTOTAL, CKMB, CKMBINDEX, TROPONINI in the last 168 hours. BNP (last 3 results) No results for input(s): PROBNP in the last 8760 hours. HbA1C: No results for input(s): HGBA1C in the last 72 hours. CBG: No results for input(s): GLUCAP in the last 168 hours. Lipid Profile: No results for input(s): CHOL, HDL, LDLCALC, TRIG, CHOLHDL, LDLDIRECT in the last 72 hours. Thyroid Function Tests: No results for input(s): TSH, T4TOTAL, FREET4, T3FREE, THYROIDAB in the last 72 hours. Anemia Panel: No results for input(s): VITAMINB12, FOLATE, FERRITIN, TIBC, IRON, RETICCTPCT in the last 72 hours. Urine analysis:    Component Value Date/Time   COLORURINE YELLOW 11/01/2017 0530   APPEARANCEUR CLEAR 11/01/2017 0530   LABSPEC 1.021 11/01/2017 0530   PHURINE 5.0 11/01/2017 0530   GLUCOSEU NEGATIVE 11/01/2017 0530   HGBUR NEGATIVE 11/01/2017 0530   BILIRUBINUR NEGATIVE 11/01/2017 0530   KETONESUR 5 (A) 11/01/2017 0530   PROTEINUR NEGATIVE 11/01/2017 0530   UROBILINOGEN 0.2 01/20/2014 1605    NITRITE NEGATIVE 11/01/2017 0530   LEUKOCYTESUR NEGATIVE 11/01/2017 0530   Sepsis Labs: @LABRCNTIP (procalcitonin:4,lacticidven:4)  ) Recent Results (from the past 240 hour(s))  MRSA PCR Screening     Status: None   Collection Time: 11/01/17  2:00 AM  Result Value Ref Range Status   MRSA by PCR NEGATIVE NEGATIVE Final    Comment:        The GeneXpert MRSA Assay (FDA approved for NASAL specimens only), is one component of a comprehensive MRSA colonization surveillance program. It is not intended to diagnose MRSA infection nor to guide or monitor treatment for MRSA infections. Performed at [facility], [place] [address], [place], [place] [address]   Urine Culture     Status: Abnormal   Collection Time: 11/01/17  5:30 AM  Result Value Ref Range Status   Specimen Description   Final    URINE, CLEAN CATCH Performed at [facility], [place] [address] [place], [place] [address]    Special Requests   Final    NONE Performed at [facility]  [facility], [place] [address], [place], [place] [address]    Culture MULTIPLE SPECIES PRESENT, SUGGEST RECOLLECTION (A)  Final   Report Status 11/02/2017 FINAL  Final      Studies: No results found.  Scheduled Meds: . docusate sodium  100 mg Oral BID  . enoxaparin (LOVENOX) injection  30 mg Subcutaneous Q24H  . mouth rinse  15 mL Mouth Rinse BID  . oxyCODONE  10 mg Oral Q12H  . senna-docusate  2 tablet Oral BID    Continuous Infusions: . lactated ringers 75 mL/hr at 11/03/17 1236     LOS: 3 days     Kayleen Memos, MD [facility] Pager [phone]  If 7PM-7AM, please contact night-coverage [email or web address] Password [facility] 11/03/2017, 5:46 PM

## 2017-11-03 NOTE — Progress Notes (Signed)
Pt has not voided throughout shift. Pt does have purewick in place. Bladder scan showed 86 ml of urine in bladder. Rn will continue to monitor.

## 2017-11-03 NOTE — Clinical Social Work Note (Signed)
Clinical Social Work Assessment  Patient Details  Name: ALAIRA Villarreal MRN: [id number] Date of Birth: December 25, 1924  Date of referral:  11/03/17               Reason for consult:  Discharge Planning                Permission sought to share information with:  Case Manager Permission granted to share information::  Yes, Verbal Permission Granted  Name::       [name]::  SNF   Relationship::    Daughter   Contact Information:    [phone], [phone]  Housing/Transportation Living arrangements for the past 2 months:  [place] of Information:    Patient Interpreter Needed:  None Criminal Activity/Legal Involvement Pertinent to Current Situation/Hospitalization:  No - Comment as needed Significant Relationships:  Adult Children Lives with:  Adult Children Do you feel safe going back to the place where you live?  Yes Need for family participation in patient care:  Yes  Care giving concerns:   SNF placement for rehab. Patient admitted after fall 9/8 and sustained right intertrochanteric femur fracture. Post op right intramedullary fixation. Patient has advanced dementia and requires assistance with ADL's.   Social Worker assessment / plan:  CSW discussed discharge planning with patient daughter. She reports the family is hopeful to get the patient in a SNF for rehab. Patient daughter the patient primary caretaker. She reports the patient requires assistance with feeding, bathing and dressing. During the day she report the patient has [facility] services also help in the home.  She reports the patient was walking independently about six months ago and now uses a walker. She reports the patient has fallen several times in the past month due to advance dementia and difficultly  following cues.  Patient daughter explain about four years ago the patient went to [facility] for rehab. Patient daughter is hopeful the patient can return there to complete  rehab. CSW explain SNF process/ [employer] required authorization process prior to discharge. Patient daughter reports understanding. CSW reached out to [facility]. CSW confirm bed availability. The facility initiated insurance authorization.   Plan:SNF placement for rehab.   Employment status:  Retired Nurse, adult PT Recommendations:  [place] / Referral to community resources:  [place]  Patient/Family's Response to care:  Agreeable and Responding well to care.   Patient/Family's Understanding of and Emotional Response to Diagnosis, Current Treatment, and Prognosis:  Patient daughter has a good understanding of the diagnosis and current treatment.   Emotional Assessment Appearance:  Appears stated age Attitude/Demeanor/Rapport:    Affect (typically observed):  Unable to Assess Orientation:  (Responds to Voice ) Alcohol / Substance use:  Not Applicable Psych involvement (Current and /or in the community):  No (Comment)  Discharge Needs  Concerns to be addressed:  Discharge Planning Concerns Readmission within the last 30 days:  No Current discharge risk:  Dependent with Mobility, Physical Impairment Barriers to Discharge:  Continued Medical Work up, [place], [place] 11/03/2017, 2:38 PM

## 2017-11-03 NOTE — Progress Notes (Addendum)
Bolus being given per md orders. Pt still has yet to void on this shift. Pt refused labs this am, stating it will hurt. rn has requested lab come back around 7am to attempt again. Pt has also refused drinking fluids as well throughout night. rn has encouraged pt to drink fluids with no success.

## 2017-11-04 ENCOUNTER — Inpatient Hospital Stay (HOSPITAL_COMMUNITY): Payer: Medicare HMO

## 2017-11-04 LAB — CREATININE, SERUM
Creatinine, Ser: 1.01 mg/dL — ABNORMAL HIGH (ref 0.44–1.00)
GFR calc Af Amer: 55 mL/min — ABNORMAL LOW (ref >60)
GFR calc non Af Amer: 47 mL/min — ABNORMAL LOW (ref >60)

## 2017-11-04 LAB — CBC
HEMATOCRIT: 26.8 % — AB (ref 36.0–46.0)
Hemoglobin: 8.9 g/dL — ABNORMAL LOW (ref 12.0–15.0)
MCH: 31.7 pg (ref 26.0–34.0)
MCHC: 33.2 g/dL (ref 30.0–36.0)
MCV: 95.4 fL (ref 78.0–100.0)
Platelets: 205 10*3/uL (ref 150–400)
RBC: 2.81 MIL/uL — AB (ref 3.87–5.11)
RDW: 12.9 % (ref 11.5–15.5)
WBC: 11.1 10*3/uL — ABNORMAL HIGH (ref 4.0–10.5)

## 2017-11-04 MED ORDER — LIP MEDEX EX OINT
TOPICAL_OINTMENT | CUTANEOUS | Status: AC
  Administered 2017-11-04: 12:00:00
  Filled 2017-11-04: qty 7

## 2017-11-04 MED ORDER — SENNOSIDES-DOCUSATE SODIUM 8.6-50 MG PO TABS: 2.0000 | ORAL_TABLET | Freq: Two times a day (BID) | ORAL | 0 refills | Status: AC

## 2017-11-04 MED ORDER — HYDRALAZINE HCL 20 MG/ML IJ SOLN: 10.0000 mg | Freq: Four times a day (QID) | INTRAMUSCULAR | Status: DC | PRN

## 2017-11-04 MED ORDER — AMLODIPINE BESYLATE 5 MG PO TABS: 5.0000 mg | ORAL_TABLET | Freq: Every day | ORAL | 0 refills | Status: AC

## 2017-11-04 MED ORDER — AMLODIPINE BESYLATE 5 MG PO TABS: 2.5000 mg | ORAL_TABLET | Freq: Every day | ORAL | Status: DC

## 2017-11-04 MED ORDER — AMLODIPINE BESYLATE 5 MG PO TABS
5.0000 mg | ORAL_TABLET | Freq: Every day | ORAL | Status: DC
  Administered 2017-11-04 – 2017-11-08 (×5): 5 mg via ORAL
  Filled 2017-11-04 (×5): qty 1

## 2017-11-04 NOTE — Progress Notes (Signed)
    Subjective:  Patient reports pain as mild to moderate.  Denies N/V/CP/SOB. No c/o.  Objective:   VITALS:   Vitals:   11/03/17 0724 11/03/17 1357 11/03/17 2108 11/04/17 0500  BP: (!) 147/76 (!) 142/74 (!) 158/82 (!) 166/54  Pulse: (!) 104 (!) 101 (!) 118 (!) 112  Resp:  17 16 17   Temp:  99.1 F (37.3 C)  98 F (36.7 C)  TempSrc:  Oral  Oral  SpO2: 95% 95% 98% 93%  Weight:      Height:        NAD ABD soft Sensation intact distally Intact pulses distally Dorsiflexion/Plantar flexion intact Incision: dressing C/D/I Compartment soft   Lab Results  Component Value Date   WBC 11.1 (H) 11/04/2017   HGB 8.9 (L) 11/04/2017   HCT 26.8 (L) 11/04/2017   MCV 95.4 11/04/2017   PLT 205 11/04/2017   BMET    Component Value Date/Time   NA 141 11/03/2017 0800   K 4.1 11/03/2017 0800   CL 107 11/03/2017 0800   CO2 25 11/03/2017 0800   GLUCOSE 106 (H) 11/03/2017 0800   BUN 22 11/03/2017 0800   CREATININE 1.01 (H) 11/04/2017 0446   CALCIUM 8.2 (L) 11/03/2017 0800   GFRNONAA 47 (L) 11/04/2017 0446   GFRAA 55 (L) 11/04/2017 0446     Assessment/Plan: 3 Days Post-Op   Principal Problem:   Closed right hip fracture (HCC) Active Problems:   Senile dementia with delusional features ([place])   Essential hypertension   Closed comminuted intertrochanteric fracture of proximal end of right femur ([place])   WBAT with walker DVT ppx: Lovenox, SCDs, TEDS PO pain control PT/OT Dispo: D/C to SNF   [place] Cork Ling Flesch 11/04/2017, 10:18 AM   Rod Can, MD Cell [phone]

## 2017-11-04 NOTE — Discharge Summary (Signed)
Discharge Summary  Sylvia Villarreal:[id number] DOB: 04-24-1924  PCP: Dorothyann Peng, NP  Admit date: 10/31/2017 Discharge date: 11/04/2017  Time spent: 25 minutes  Recommendations for Outpatient Follow-up:  1. Follow-up with your PCP 2. Follow-up with orthopedic surgery, Dr. Lyla Glassing 3. Take your medications as prescribed 4. Continue physical therapy 5. Fall precautions  Discharge Diagnoses:  Active Hospital Problems   Diagnosis Date Noted  . Closed right hip fracture ([place]) 10/31/2017  . Closed comminuted intertrochanteric fracture of proximal end of right femur ([place]) 11/01/2017  . Senile dementia with delusional features ([place]) 01/13/2010  . Essential hypertension 03/23/2007    Resolved Hospital Problems  No resolved problems to display.    Discharge Condition: Stable  Diet recommendation: Resume previous diet  Vitals:   11/03/17 2108 11/04/17 0500  BP: (!) 158/82 (!) 166/54  Pulse: (!) 118 (!) 112  Resp: 16 17  Temp:  98 F (36.7 C)  SpO2: 98% 93%    History of present illness:   Sylvia S Jonesis a 84 y.o.femalewith medical history significant forHTN, dementia presented to the ER after a fall on her R side, after she lost her balance while trying to stand up from a seated position and trying to grab her walker. Pt denies any chest pain, SOB, abdominal pain, fever/chills, cough. Pt is demented, and doesn't talk much. Xray of R hip showed comminuted intertrochanteric fracture. Ortho consulted. Pt admitted for further management.  -11/01/2017: POD #0 post right hip fracture repair.   -11/03/2017: No acute distress.  Denies pain with pain control. She is alert and pleasantly demented.   11/04/2017: Patient seen and examined at her bedside.  No acute events overnight.  Patient is pleasantly demented, does not appear to be in distress.  Denies any pain.  On the day of discharge, the patient was hemodynamically stable.  She will need to follow-up with orthopedic surgery Dr.  Lyla Glassing, her PCP posthospitalization.  She will also continue physical therapy as recommended by orthopedic surgery.  Hospital Course:  Principal Problem:   Closed right hip fracture (HCC) Active Problems:   Senile dementia with delusional features [facility])   Essential hypertension   Closed comminuted intertrochanteric fracture of proximal end of right femur (HCC)  Closed right hip fracture secondary to mechanical fall X-ray showed R hip comminuted intertrochanteric fracture POD #3 post right hip fracture repair Continue pain management Continue bowel regimen Continue physical therapy as recommended by orthopedic surgery  Acute blood loss anemia post surgery Hemoglobin is 10.5 from baseline 13.2 Hemoglobin dropped from 10.5 to 8.9 today Hemoglobin plateaued at 8.9 No sign of overt bleeding Follow-up with PCP outpatient  AKI on CKD 3 Baseline creatinine 1.0 with GFR of 55 Creatinine on presentation 1.30 AKI is improving and nearly resolving Creatinine 1.01 today 11/04/2017 Avoid nephrotoxic agents/dehydration/hypotension Follow-up with PCP posthospitalization  Resolved accelerated hypertension most likely related to her pain On antihypertensive medications at home Holding off losartan due to AKI Resume amlodipine 5 mg daily (was on 2.5 mg daily at home)  Sinus tachycardia possibly related to dehydration Avoid dehydration Encourage oral fluid intake  Resolved leukocytosis Likely reactive UA chest X chest x-ray unremarkable  Dementia Pleasantly demented At baseline usually quiet and doesn't say much Reorient as needed Fall precautions     Code Status: DNR  Family Communication: Daughter at bedside on 11/01/2017 and 11/03/2017  Disposition Plan: SNF/short-term rehab post surgery when orthopedic surgery signs off   Consultants:  Orthopedic surgery  CSW  Procedures:  Post right hip fracture repair  Antimicrobials:  None  DVT  prophylaxis: SCDs     Discharge Exam: BP (!) 166/54 (BP Location: Right Arm)   Pulse (!) 112   Temp 98 F (36.7 C) (Oral)   Resp 17   Ht 5\' 4"  (1.626 m)   Wt 60.2 kg   SpO2 93%   BMI 22.78 kg/m  . General: 84 y.o. year-old female well developed well nourished in no acute distress.  Alert and pleasant in the setting of advanced dementia. . Cardiovascular: Regular rate and rhythm with no rubs or gallops.  No thyromegaly or JVD noted.   Marland Kitchen Respiratory: Clear to auscultation with no wheezes or rales. Good inspiratory effort. . Abdomen: Soft nontender nondistended with normal bowel sounds x4 quadrants. . Musculoskeletal: No lower extremity edema. 2/4 pulses in all 4 extremities.  Surgical dressing at right hip appears clean with no drainage. Marland Kitchen Psychiatry: Mood is appropriate for condition and setting  Discharge Instructions You were cared for by a hospitalist during your hospital stay. If you have any questions about your discharge medications or the care you received while you were in the hospital after you are discharged, you can call the unit and asked to speak with the hospitalist on call if the hospitalist that took care of you is not available. Once you are discharged, your primary care physician will handle any further medical issues. Please note that NO REFILLS for any discharge medications will be authorized once you are discharged, as it is imperative that you return to your primary care physician (or establish a relationship with a primary care physician if you do not have one) for your aftercare needs so that they can reassess your need for medications and monitor your lab values.   Allergies as of 11/04/2017      Reactions   Sulfonamide Derivatives Other (See Comments)   Unknown      Medication List    STOP taking these medications   ADVIL PO   losartan 100 MG tablet Commonly known as:  COZAAR     TAKE these medications   amLODipine 5 MG tablet Commonly known as:   NORVASC Take 1 tablet (5 mg total) by mouth daily. What changed:    medication strength  how much to take   enoxaparin 30 MG/0.3ML injection Commonly known as:  LOVENOX Inject 0.3 mLs (30 mg total) into the skin daily.   HYDROcodone-acetaminophen 5-325 MG tablet Commonly known as:  NORCO/VICODIN Take 1-2 tablets by mouth every 4 (four) hours as needed for moderate pain.   senna-docusate 8.6-50 MG tablet Commonly known as:  Senokot-S Take 2 tablets by mouth 2 (two) times daily.      Allergies  Allergen Reactions  . Sulfonamide Derivatives Other (See Comments)    Unknown   Follow-up Information    Swinteck, Aaron Edelman, MD. Schedule an appointment as soon as possible for a visit in 2 weeks.   Specialty:  Orthopedic Surgery Why:  For wound re-check Contact information: [address] [place] [address] [phone]        Dorothyann Peng, NP. Call in 1 day(s).   Specialty:  Family Medicine Why:  Please call for an appointment. Contact information: [place] [address] [phone]            The results of significant diagnostics from this hospitalization (including imaging, microbiology, ancillary and laboratory) are listed below for reference.    Significant Diagnostic Studies: Pelvis Portable  Result Date: 11/01/2017 CLINICAL DATA:  Right hip fracture EXAM: PORTABLE PELVIS 1-2 VIEWS COMPARISON:  01/21/2014, 10/31/2017 FINDINGS: Intramedullary rod and distal screw fixation of the right femur across intertrochanteric fracture. Displaced lesser trochanteric fracture fragment. Right femoral head projects in joint. Gas within the soft tissues. IMPRESSION: Interval surgical fixation of comminuted right intertrochanteric fracture with expected surgical changes Electronically Signed   By: Donavan Foil M.D.   On: 11/01/2017 20:00   Dg Chest Port 1 View  Result Date: 10/31/2017 CLINICAL DATA:  Right hip fracture. Preoperative  evaluation. EXAM: PORTABLE CHEST 1 VIEW COMPARISON:  None. FINDINGS: Normal sized heart. Tortuous and calcified thoracic aorta. Elevated right hemidiaphragm with minimal linear atelectasis or scarring at the right lung base. Clear left lung. Diffuse osteopenia. IMPRESSION: Elevated right hemidiaphragm with minimal linear atelectasis or scarring at the right lung base. Otherwise, unremarkable examination. Electronically Signed   By: Claudie Revering M.D.   On: 10/31/2017 17:03   Dg C-arm 1-60 Min-no Report  Result Date: 11/01/2017 Fluoroscopy was utilized by the requesting physician.  No radiographic interpretation.   Dg Hip Operative Unilat W Or W/o Pelvis Right  Result Date: 11/01/2017 CLINICAL DATA:  Right hip IM nail EXAM: OPERATIVE RIGHT HIP (WITH PELVIS IF PERFORMED) 2 VIEWS TECHNIQUE: Fluoroscopic spot image(s) were submitted for interpretation post-operatively. COMPARISON:  10/31/2017 FINDINGS: Two intraoperative spot images demonstrate placement of IM nail and hip screw across the right femoral intertrochanteric fracture. Anatomic alignment. No hardware complicating feature. IMPRESSION: Internal fixation of the right intertrochanteric fracture with anatomic alignment. Electronically Signed   By: Rolm Baptise M.D.   On: 11/01/2017 19:22   Dg Hip Unilat  With Pelvis 2-3 Views Right  Result Date: 10/31/2017 CLINICAL DATA:  Fall at home.  Right hip pain.  Initial encounter. EXAM: DG HIP (WITH OR WITHOUT PELVIS) 2-3V RIGHT COMPARISON:  None. FINDINGS: Comminuted intertrochanteric fracture of the right hip is seen. No evidence of dislocation. IMPRESSION: Comminuted intertrochanteric fracture of right hip. Electronically Signed   By: Earle Gell M.D.   On: 10/31/2017 15:10    Microbiology: Recent Results (from the past 240 hour(s))  MRSA PCR Screening     Status: None   Collection Time: 11/01/17  2:00 AM  Result Value Ref Range Status   MRSA by PCR NEGATIVE NEGATIVE Final    Comment:        The  GeneXpert MRSA Assay (FDA approved for NASAL specimens only), is one component of a comprehensive MRSA colonization surveillance program. It is not intended to diagnose MRSA infection nor to guide or monitor treatment for MRSA infections. Performed at [facility], [place] [address] [place], [place] [address]   Urine Culture     Status: Abnormal   Collection Time: 11/01/17  5:30 AM  Result Value Ref Range Status   Specimen Description   Final    URINE, CLEAN CATCH Performed at [facility], [place] [address] [place], [place] [address]    Special Requests   Final    NONE Performed at [facility], [place] [address], [place], [place] [address]    Culture MULTIPLE SPECIES PRESENT, SUGGEST RECOLLECTION (A)  Final   Report Status 11/02/2017 FINAL  Final     Labs: Basic Metabolic Panel: Recent Labs  Lab 10/31/17 1621 11/01/17 0400 11/02/17 0444 11/03/17 0800 11/04/17 0446  NA 142 141 141 141  --   K 3.7 4.2 4.0 4.1  --   CL 105 107 107 107  --  CO2 26 25 25 25   --   GLUCOSE 156* 131* 158* 106*  --   BUN 22 24* 21 22  --   CREATININE 1.30* 1.26* 1.16* 1.29* 1.01*  CALCIUM 8.8* 8.6* 8.7* 8.2*  --    Liver Function Tests: No results for input(s): AST, ALT, ALKPHOS, BILITOT, PROT, ALBUMIN in the last 168 hours. No results for input(s): LIPASE, AMYLASE in the last 168 hours. No results for input(s): AMMONIA in the last 168 hours. CBC: Recent Labs  Lab 10/31/17 1621 11/01/17 0400 11/02/17 0444 11/03/17 0800 11/04/17 0446  WBC 14.4* 12.3* 14.5* 12.4* 11.1*  NEUTROABS 12.7*  --   --   --   --   HGB 13.2 11.2* 10.5* 8.9* 8.9*  HCT 39.1 33.6* 31.4* 26.6* 26.8*  MCV 94.4 94.1 94.0 95.3 95.4  PLT 264 242 208 201 205   Cardiac Enzymes: No results for input(s): CKTOTAL, CKMB, CKMBINDEX, TROPONINI in the last 168 hours. BNP: BNP (last 3 results) No results for input(s): BNP in the last 8760 hours.  ProBNP (last 3  results) No results for input(s): PROBNP in the last 8760 hours.  CBG: No results for input(s): GLUCAP in the last 168 hours.     Signed:  Kayleen Memos, MD [facility] 11/04/2017, 1:42 PM

## 2017-11-04 NOTE — Discharge Instructions (Signed)
 Dr. Brian Swinteck Adult Hip & Knee Specialist [facility] [address] , [place] [address] [phone]   POSTOPERATIVE DIRECTIONS    Hip Rehabilitation, Guidelines Following Surgery   WEIGHT BEARING Weight bearing as tolerated with assist device (walker, cane, etc) as directed, use it as long as suggested by your surgeon or therapist, typically at least 4-6 weeks.   HOME CARE INSTRUCTIONS  Remove items at home which could result in a fall. This includes throw rugs or furniture in walking pathways.  Continue medications as instructed at time of discharge.  You may have some home medications which will be placed on hold until you complete the course of blood thinner medication.  4 days after discharge, you may start showering. No tub baths or soaking your incisions. Do not put on socks or shoes without following the instructions of your caregivers.   Sit on chairs with arms. Use the chair arms to help push yourself up when arising.  Arrange for the use of a toilet seat elevator so you are not sitting low.   Walk with walker as instructed.  You may resume a sexual relationship in one month or when given the OK by your caregiver.  Use walker as long as suggested by your caregivers.  Avoid periods of inactivity such as sitting longer than an hour when not asleep. This helps prevent blood clots.  You may return to work once you are cleared by your surgeon.  Do not drive a car for 6 weeks or until released by your surgeon.  Do not drive while taking narcotics.  Wear elastic stockings for two weeks following surgery during the day but you may remove then at night.  Make sure you keep all of your appointments after your operation with all of your doctors and caregivers. You should call the office at the above phone number and make an appointment for approximately two weeks after the date of your surgery. Please pick up a stool softener and laxative  for home use as long as you are requiring pain medications.  ICE to the affected hip every three hours for 30 minutes at a time and then as needed for pain and swelling. Continue to use ice on the hip for pain and swelling from surgery. You may notice swelling that will progress down to the foot and ankle.  This is normal after surgery.  Elevate the leg when you are not up walking on it.   It is important for you to complete the blood thinner medication as prescribed by your doctor.  Continue to use the breathing machine which will help keep your temperature down.  It is common for your temperature to cycle up and down following surgery, especially at night when you are not up moving around and exerting yourself.  The breathing machine keeps your lungs expanded and your temperature down.  RANGE OF MOTION AND STRENGTHENING EXERCISES  These exercises are designed to help you keep full movement of your hip joint. Follow your caregiver's or physical therapist's instructions. Perform all exercises about fifteen times, three times per day or as directed. Exercise both hips, even if you have had only one joint replacement. These exercises can be done on a training (exercise) mat, on the floor, on a table or on a bed. Use whatever works the best and is most comfortable for you. Use music or television while you are exercising so that the exercises are a pleasant break in your day. This   will make your life better with the exercises acting as a break in routine you can look forward to.  Lying on your back, slowly slide your foot toward your buttocks, raising your knee up off the floor. Then slowly slide your foot back down until your leg is straight again.  Lying on your back spread your legs as far apart as you can without causing discomfort.  Lying on your side, raise your upper leg and foot straight up from the floor as far as is comfortable. Slowly lower the leg and repeat.  Lying on your back, tighten up the  muscle in the front of your thigh (quadriceps muscles). You can do this by keeping your leg straight and trying to raise your heel off the floor. This helps strengthen the largest muscle supporting your knee.  Lying on your back, tighten up the muscles of your buttocks both with the legs straight and with the knee bent at a comfortable angle while keeping your heel on the floor.   SKILLED REHAB INSTRUCTIONS: If the patient is transferred to a skilled rehab facility following release from the hospital, a list of the current medications will be sent to the facility for the patient to continue.  When discharged from the skilled rehab facility, please have the facility set up the patient's Home Health Physical Therapy prior to being released. Also, the skilled facility will be responsible for providing the patient with their medications at time of release from the facility to include their pain medication and their blood thinner medication. If the patient is still at the rehab facility at time of the two week follow up appointment, the skilled rehab facility will also need to assist the patient in arranging follow up appointment in our office and any transportation needs.  MAKE SURE YOU:  Understand these instructions.  Will watch your condition.  Will get help right away if you are not doing well or get worse.  Pick up stool softner and laxative for home use following surgery while on pain medications. Daily dry dressing changes as needed. In 4 days, you may remove your dressings and begin taking showers - no tub baths or soaking the incisions. Continue to use ice for pain and swelling after surgery. Do not use any lotions or creams on the incision until instructed by your surgeon.   

## 2017-11-04 NOTE — Care Management Important Message (Signed)
Important Message  Patient Details  Name: Sylvia Villarreal MRN: [id number] Date of Birth: April 01, 1924   Medicare Important Message Given:  Yes    Kerin Salen 11/04/2017, 12:40 [place] Message  Patient Details  Name: Sylvia Villarreal MRN: [id number] Date of Birth: 12/02/1924   Medicare Important Message Given:  Yes    Kerin Salen 11/04/2017, 12:40 PM

## 2017-11-04 NOTE — Plan of Care (Signed)
Plan of care discussed with patient and daughter 

## 2017-11-04 NOTE — Progress Notes (Signed)
Pt's with MEWS red alert, temp 102.3 A and pulse 129.  Pt currently a DNR. Tylenol Supp 350 given, ice bags applied, temp turned down in room and heavy blankets remove, pain medication given as well. AC and RRT charge Nurse notified, on call Physician Paged. Follow up temp 100.8 A and pulse 123.

## 2017-11-05 LAB — URINALYSIS, ROUTINE W REFLEX MICROSCOPIC
BILIRUBIN URINE: NEGATIVE
GLUCOSE, UA: NEGATIVE mg/dL
KETONES UR: 5 mg/dL — AB
NITRITE: NEGATIVE
PROTEIN: 30 mg/dL — AB
Specific Gravity, Urine: 1.013 (ref 1.005–1.030)
pH: 7 (ref 5.0–8.0)

## 2017-11-05 LAB — CBC
HEMATOCRIT: 26.9 % — AB (ref 36.0–46.0)
Hemoglobin: 9 g/dL — ABNORMAL LOW (ref 12.0–15.0)
MCH: 31.9 pg (ref 26.0–34.0)
MCHC: 33.5 g/dL (ref 30.0–36.0)
MCV: 95.4 fL (ref 78.0–100.0)
PLATELETS: 238 10*3/uL (ref 150–400)
RBC: 2.82 MIL/uL — ABNORMAL LOW (ref 3.87–5.11)
RDW: 12.9 % (ref 11.5–15.5)
WBC: 9.7 10*3/uL (ref 4.0–10.5)

## 2017-11-05 LAB — BASIC METABOLIC PANEL
Anion gap: 9 (ref 5–15)
BUN: 13 mg/dL (ref 8–23)
CALCIUM: 8.2 mg/dL — AB (ref 8.9–10.3)
CO2: 27 mmol/L (ref 22–32)
CREATININE: 0.91 mg/dL (ref 0.44–1.00)
Chloride: 104 mmol/L (ref 98–111)
GFR, EST NON AFRICAN AMERICAN: 54 mL/min — AB (ref >60)
Glucose, Bld: 108 mg/dL — ABNORMAL HIGH (ref 70–99)
Potassium: 3.9 mmol/L (ref 3.5–5.1)
Sodium: 140 mmol/L (ref 135–145)

## 2017-11-05 LAB — PROCALCITONIN: Procalcitonin: <0.1 ng/mL

## 2017-11-05 LAB — LACTIC ACID, PLASMA: LACTIC ACID, VENOUS: 1.5 mmol/L (ref 0.5–1.9)

## 2017-11-05 MED ORDER — IPRATROPIUM-ALBUTEROL 0.5-2.5 (3) MG/3ML IN SOLN
3.0000 mL | Freq: Three times a day (TID) | RESPIRATORY_TRACT | Status: DC
  Administered 2017-11-06 (×3): 3 mL via RESPIRATORY_TRACT
  Filled 2017-11-05 (×3): qty 3

## 2017-11-05 MED ORDER — ENOXAPARIN SODIUM 40 MG/0.4ML ~~LOC~~ SOLN
40.0000 mg | SUBCUTANEOUS | Status: DC
  Administered 2017-11-06 – 2017-11-08 (×3): 40 mg via SUBCUTANEOUS
  Filled 2017-11-05 (×3): qty 0.4

## 2017-11-05 MED ORDER — DICLOFENAC SODIUM 1 % TD GEL
2.0000 g | Freq: Three times a day (TID) | TRANSDERMAL | Status: DC
  Administered 2017-11-05 – 2017-11-08 (×7): 2 g via TOPICAL
  Filled 2017-11-05: qty 100

## 2017-11-05 MED ORDER — IPRATROPIUM-ALBUTEROL 0.5-2.5 (3) MG/3ML IN SOLN
3.0000 mL | Freq: Four times a day (QID) | RESPIRATORY_TRACT | Status: DC
  Administered 2017-11-05: 3 mL via RESPIRATORY_TRACT
  Filled 2017-11-05: qty 3

## 2017-11-05 MED ORDER — ALBUTEROL SULFATE (2.5 MG/3ML) 0.083% IN NEBU: 2.5000 mg | INHALATION_SOLUTION | RESPIRATORY_TRACT | Status: DC | PRN

## 2017-11-05 MED ORDER — DICLOFENAC SODIUM 1 % TD GEL: 2.0000 g | Freq: Three times a day (TID) | TRANSDERMAL | 0 refills | Status: AC

## 2017-11-05 NOTE — Progress Notes (Signed)
Physical Therapy Treatment Patient Details Name: Sylvia Villarreal MRN: [id number] DOB: 30-Sep-1924 Today's Date: 11/05/2017    History of Present Illness 84 yo female admitted 10/31/17 after fall 10/31/17 and sustained Right intertrochanteric femur fracture. Post op right intramedullary fixation on 11/01/17    PT Comments    Pt requiring more assist this evening, complicated by pt requiring pericare midsession. Pt With some vocalizations about pain this session, mostly during scooting and RLE movements. Pt with decreased weight shift to RLE in standing. Will continue to follow acutely.     Follow Up Recommendations  SNF;Supervision for mobility/OOB     Equipment Recommendations  None recommended by PT    Recommendations for Other Services       Precautions / Restrictions Precautions Precautions: Fall Restrictions Weight Bearing Restrictions: No RLE Weight Bearing: Weight bearing as tolerated    Mobility  Bed Mobility Overal bed mobility: Needs Assistance Bed Mobility: Sit to Supine       Sit to supine: Total assist;+2 for physical assistance   General bed mobility comments: Sit to supine assist for lowering trunk, LE management, and scooting pt up in bed with bed pads.   Transfers Overall transfer level: Needs assistance   Transfers: Sit to/from Stand;Stand Pivot Transfers Sit to Stand: +2 physical assistance;+2 safety/equipment;Max assist Stand pivot transfers: Total assist;+2 physical assistance;+2 safety/equipment       General transfer comment: Pt with max assist for sit to stand. While standing, pt with BM and urination. Pt stood for approximately 2 minutes with her own UE use on steady bar, PT hip extension and trunk facilitation. Pt sat back down and stood up again, flapped one seat down on steady and unable to put the other one down due to pt not getting to full hip extension or shifting weight to the RLE. PT total assist for hip and trunk support while moving to bed.  Pt required max assist for eccentric lowering to the bed.   Ambulation/Gait                 Stairs             Wheelchair Mobility    Modified Rankin (Stroke Patients Only)       Balance Overall balance assessment: Needs assistance;History of Falls Sitting-balance support: Bilateral upper extremity supported;Feet supported Sitting balance-Leahy Scale: Fair     Standing balance support: During functional activity Standing balance-Leahy Scale: Poor Standing balance comment: relies on external support for standing                            Cognition                                              Exercises      General Comments        Pertinent Vitals/Pain Pain Assessment: Faces Faces Pain Scale: Hurts even more Pain Location: R hip, during scooting and transfers  Pain Descriptors / Indicators: Guarding;Grimacing;Moaning Pain Intervention(s): Limited activity within patient's tolerance;Repositioned;Monitored during session    Home Living                      Prior Function            PT Goals (current goals can now be found in the care plan section)  Acute Rehab PT Goals Patient Stated Goal: per family to go to rehab PT Goal Formulation: With patient/family Time For Goal Achievement: 11/16/17 Potential to Achieve Goals: Fair Progress towards PT goals: Progressing toward goals    Frequency    Min 2X/week(may need more  for ins.)      PT Plan Current plan remains appropriate    Co-evaluation              AM-PAC PT "6 Clicks" Daily Activity  Outcome Measure  Difficulty turning over in bed (including adjusting bedclothes, sheets and blankets)?: Unable Difficulty moving from lying on back to sitting on the side of the bed? : Unable Difficulty sitting down on and standing up from a chair with arms (e.g., wheelchair, bedside commode, etc,.)?: Unable Help needed moving to and from a bed to chair  (including a wheelchair)?: A Lot Help needed walking in hospital room?: Total Help needed climbing 3-5 steps with a railing? : Total 6 Click Score: 7    End of Session Equipment Utilized During Treatment: Gait belt Activity Tolerance: Patient limited by pain;Patient limited by fatigue Patient left: with call bell/phone within reach;in bed;with bed alarm set;with family/visitor present Nurse Communication: Mobility status PT Visit Diagnosis: Unsteadiness on feet (R26.81);Pain;History of falling (Z91.81) Pain - Right/Left: Right Pain - part of body: Hip     Time: 1845-1915 PT Time Calculation (min) (ACUTE ONLY): 30 min  Charges:  $Therapeutic Activity: 23-37 mins                     Julien Girt, PT Acute Rehabilitation Services Pager [phone]  Office [phone]    Violanda Bobeck D Cesario Weidinger 11/05/2017, 7:30 PM

## 2017-11-05 NOTE — Progress Notes (Addendum)
SNF placement-[facility] still pending at this time. CSW will inform medical staff when it is received.  4:45-SNF authorization still pending. Patient daughter called [facility] for an update and she was given same the information "still pending, under review."  Patient cannot safely discharge home. [employer] does not authorize on weekend days.  CSW will leave a note for the weekend social worker to check with facility in the a.m just in case it was received overnight.  Patient daughter aware.  CSW will continue to follow for discharge needs.   Kathrin Greathouse, Marlinda Mike, MSW Clinical Social Worker  [phone] 11/05/2017  9:03 AM

## 2017-11-05 NOTE — Plan of Care (Signed)
Pt alert, confused this am. Daughter at the bedside. UA sent, RN will monitor.

## 2017-11-05 NOTE — Progress Notes (Signed)
Physical Therapy Treatment Patient Details Name: Sylvia Villarreal MRN: [id number] DOB: January 29, 1925 Today's Date: 11/05/2017    History of Present Illness 84 yo female admitted 10/31/17 after fall 10/31/17 and sustained Right intertrochanteric femur fracture. Post op right intramedullary fixation on 11/01/17    PT Comments    Pt with much more engagement during PT session today. Pt following short one step commands somewhat consistently today, responding well to "up" for sit to stands and "sit" for moving to recliner. Pt also requiring less physical assist for standing in stedy today. Pt with some vocalizations today during session due to hip pain, but limited. Will continue to follow acutely. Plan for SNF still remains appropriate.    Follow Up Recommendations  SNF;Supervision for mobility/OOB     Equipment Recommendations  None recommended by PT    Recommendations for Other Services       Precautions / Restrictions Precautions Precautions: Fall Restrictions Weight Bearing Restrictions: No RLE Weight Bearing: Weight bearing as tolerated    Mobility  Bed Mobility Overal bed mobility: Needs Assistance Bed Mobility: Supine to Sit;Rolling Rolling: Max assist   Supine to sit: Total assist;+2 for physical assistance;HOB elevated     General bed mobility comments: Rolling for pericare. assist for trunk elevation, LE management, and scooting to EOB with bed pad.   Transfers Overall transfer level: Needs assistance   Transfers: Sit to/from Stand;Stand Pivot Transfers Sit to Stand: +2 physical assistance;Mod assist;Max assist;Min assist;+2 safety/equipment Stand pivot transfers: +2 physical assistance;Max assist;+2 safety/equipment       General transfer comment: Pt with UE initiation when asked to place hands on stedy grab bar. Assist needed to complete this motion. Pt initially requiring max assist for trunk elevation and hip extension facilitation. After 2 mini sit to stands using  stedy, pt with increased initiation and physical assist to stand. Pt performed approximately 20 mini sit to stands on stedy, requiring min assist at the end to stand. Pt able to stand for approximately 3 seconds before sitting against stedy seat.  Transferred pt to recliner with use of stedy, pt with command following in regards to "sit" once over recliner.    Ambulation/Gait                 Stairs             Wheelchair Mobility    Modified Rankin (Stroke Patients Only)       Balance Overall balance assessment: Needs assistance;History of Falls Sitting-balance support: Bilateral upper extremity supported;Feet supported Sitting balance-Leahy Scale: Fair     Standing balance support: During functional activity Standing balance-Leahy Scale: Poor Standing balance comment: relies on external support for standing                            Cognition Arousal/Alertness: Awake/alert Behavior During Therapy: WFL for tasks assessed/performed Overall Cognitive Status: History of cognitive impairments - at baseline                                        Exercises Other Exercises Other Exercises: sit to stand on stedy with stedy seat available, x20 throughout session     General Comments        Pertinent Vitals/Pain Pain Assessment: Faces Faces Pain Scale: Hurts even more Pain Location: RLE, during mobility Pain Descriptors / Indicators: Guarding;Grimacing;Moaning Pain Intervention(s): Limited  activity within patient's tolerance;Repositioned;Monitored during session;Ice applied    Home Living                      Prior Function            PT Goals (current goals can now be found in the care plan section) Acute Rehab PT Goals Patient Stated Goal: per family to go to rehab PT Goal Formulation: With patient/family Time For Goal Achievement: 11/16/17 Potential to Achieve Goals: Fair Progress towards PT goals: Progressing  toward goals    Frequency    Min 2X/week(may need more  for ins.)      PT Plan Current plan remains appropriate    Co-evaluation              AM-PAC PT "6 Clicks" Daily Activity  Outcome Measure  Difficulty turning over in bed (including adjusting bedclothes, sheets and blankets)?: Unable Difficulty moving from lying on back to sitting on the side of the bed? : Unable Difficulty sitting down on and standing up from a chair with arms (e.g., wheelchair, bedside commode, etc,.)?: Unable Help needed moving to and from a bed to chair (including a wheelchair)?: A Lot Help needed walking in hospital room?: Total Help needed climbing 3-5 steps with a railing? : Total 6 Click Score: 7    End of Session Equipment Utilized During Treatment: Gait belt Activity Tolerance: Patient tolerated treatment well;Patient limited by pain Patient left: with call bell/phone within reach;in chair;with chair alarm set Nurse Communication: Mobility status PT Visit Diagnosis: Unsteadiness on feet (R26.81);Pain;History of falling (Z91.81) Pain - Right/Left: Right Pain - part of body: Hip     Time: 1110-1140 PT Time Calculation (min) (ACUTE ONLY): 30 min  Charges:  $Therapeutic Activity: 23-37 mins                    Garen Woolbright Conception Chancy, PT, DPT  Pager # [phone]     Earle Burson D Miu Chiong 11/05/2017, 2:16 PM

## 2017-11-05 NOTE — Progress Notes (Signed)
Discharge Summary  Sylvia Villarreal:[id number] DOB: 07-24-1924  PCP: Dorothyann Peng, NP  Admit date: 10/31/2017 Discharge date: 11/05/2017  Time spent: 25 minutes  UPDATE: Discharge delayed due to awaiting insurance authorization. However, overnight, patient had a fever 101.3. Blood culture obtained x 2 in process, chest xray independently reviewed and unremarkable for any lobular infiltrates, urine culture pending.  11/05/17: Seen with daughter at bedside. Pleasant and in no acute distress.  Recommendations for Outpatient Follow-up:  1. Follow-up with your PCP 2. Follow-up with orthopedic surgery, Dr. Lyla Glassing 3. Take your medications as prescribed 4. Continue physical therapy 5. Fall precautions  Discharge Diagnoses:  Active Hospital Problems   Diagnosis Date Noted  . Closed right hip fracture ([place]) 10/31/2017  . Closed comminuted intertrochanteric fracture of proximal end of right femur ([place]) 11/01/2017  . Senile dementia with delusional features ([place]) 01/13/2010  . Essential hypertension 03/23/2007    Resolved Hospital Problems  No resolved problems to display.    Discharge Condition: Stable  Diet recommendation: Resume previous diet  Vitals:   11/05/17 0600 11/05/17 1008  BP:  (!) 143/76  Pulse:  (!) 101  Resp:  16  Temp: 97.6 F (36.4 C) 98.6 F (37 C)  SpO2:  96%    History of present illness:  Ranesha S Jonesis a 84 y.o.femalewith medical history significant forHTN, dementia presented to the ER after a fall on her R side, after she lost her balance while trying to stand up from a seated position and trying to grab her walker. Pt denies any chest pain, SOB, abdominal pain, fever/chills, cough. Pt is demented, and doesn't talk much. Xray of R hip showed comminuted intertrochanteric fracture. Ortho consulted. Pt admitted for further management.  -11/01/2017: POD #0 post right hip fracture repair.   -11/03/2017: No acute distress.  Denies pain with pain control. She  is alert and pleasantly demented.  -11/04/2017: Patient seen and examined at her bedside.  No acute events overnight.  Patient is pleasantly demented, does not appear to be in distress.  Denies any pain.  On the day of discharge, the patient was hemodynamically stable.  She will need to follow-up with orthopedic surgery Dr. Lyla Glassing, her PCP posthospitalization.  She will also continue physical therapy as recommended by orthopedic surgery.  Hospital Course:  Principal Problem:   Closed right hip fracture (HCC) Active Problems:   Senile dementia with delusional features [facility])   Essential hypertension   Closed comminuted intertrochanteric fracture of proximal end of right femur (HCC)  Closed right hip fracture secondary to mechanical fall X-ray showed R hip comminuted intertrochanteric fracture POD #4 post right hip fracture repair Continue pain management Continue bowel regimen Continue physical therapy as recommended by orthopedic surgery  Post op fever Tmax 101.3 No sign of infective process Blood cx x 2, cxr urine cx workup cxr with sign of atelectasis Incentive spirometer as tolerated So far unremarkable. U cx pending  Acute blood loss anemia post surgery Hemoglobin is 10.5 from baseline 13.2 Hemoglobin dropped from 10.5 to 8.9 today Hemoglobin plateaued at 8.9 No sign of overt bleeding Follow-up with PCP outpatient  AKI on CKD 3 Baseline creatinine 1.0 with GFR of 55 Creatinine on presentation 1.30 AKI is improving and nearly resolving Creatinine 1.01 today 11/04/2017 Avoid nephrotoxic agents/dehydration/hypotension Follow-up with PCP posthospitalization  Resolved accelerated hypertension most likely related to her pain On antihypertensive medications at home Holding off losartan due to AKI Resume amlodipine 5 mg daily (was on 2.5 mg daily at  home)  Sinus tachycardia possibly related to dehydration Avoid dehydration Encourage oral fluid intake  Resolved  leukocytosis Likely reactive UA chest X chest x-ray unremarkable  Dementia Pleasantly demented At baseline usually quiet and doesn't say much Reorient as needed Fall precautions     Code Status: DNR  Family Communication: Daughter at bedside on 11/01/2017 and 11/03/2017 and 11/05/17  Disposition Plan: SNF/short-term rehab post surgery when orthopedic surgery signs off   Consultants:  Orthopedic surgery  CSW  Procedures:  Post right hip fracture repair  Antimicrobials:  None  DVT prophylaxis: SCDs     Discharge Exam: BP (!) 143/76 (BP Location: Right Arm)   Pulse (!) 101   Temp 98.6 F (37 C) (Axillary)   Resp 16   Ht 5\' 4"  (1.626 m)   Wt 60.2 kg   SpO2 96%   BMI 22.78 kg/m  . General: 84 y.o. year-old female pleasntly demented, WD WN NAD Alert in the setting of dementia. . Cardiovascular: RRR no rubs or gallops. No thyromegaly or JVD.  Marland Kitchen Respiratory: Clear to auscultation with no wheezes or rales. Good inspiratory effort. . Abdomen: Soft nontender nondistended with normal bowel sounds x4 quadrants. . Musculoskeletal: No lower extremity edema. 2/4 pulses in all 4 extremities.  Surgical dressing at right hip appears clean with no drainage. Marland Kitchen Psychiatry: Mood is appropriate for condition and setting  Discharge Instructions You were cared for by a hospitalist during your hospital stay. If you have any questions about your discharge medications or the care you received while you were in the hospital after you are discharged, you can call the unit and asked to speak with the hospitalist on call if the hospitalist that took care of you is not available. Once you are discharged, your primary care physician will handle any further medical issues. Please note that NO REFILLS for any discharge medications will be authorized once you are discharged, as it is imperative that you return to your primary care physician (or establish a relationship with a primary  care physician if you do not have one) for your aftercare needs so that they can reassess your need for medications and monitor your lab values.   Allergies as of 11/05/2017      Reactions   Sulfonamide Derivatives Other (See Comments)   Unknown      Medication List    STOP taking these medications   ADVIL PO   losartan 100 MG tablet Commonly known as:  COZAAR     TAKE these medications   amLODipine 5 MG tablet Commonly known as:  NORVASC Take 1 tablet (5 mg total) by mouth daily. What changed:    medication strength  how much to take   diclofenac sodium 1 % Gel Commonly known as:  VOLTAREN Apply 2 g topically 3 (three) times daily.   enoxaparin 30 MG/0.3ML injection Commonly known as:  LOVENOX Inject 0.3 mLs (30 mg total) into the skin daily.   HYDROcodone-acetaminophen 5-325 MG tablet Commonly known as:  NORCO/VICODIN Take 1-2 tablets by mouth every 4 (four) hours as needed for moderate pain.   senna-docusate 8.6-50 MG tablet Commonly known as:  Senokot-S Take 2 tablets by mouth 2 (two) times daily.      Allergies  Allergen Reactions  . Sulfonamide Derivatives Other (See Comments)    Unknown    Contact information for follow-up providers    Swinteck, Aaron Edelman, MD. Schedule an appointment as soon as possible for a visit in 2 weeks.   Specialty:  Orthopedic Surgery Why:  For wound re-check Contact information: [address] [place] [address] [phone]        Dorothyann Peng, NP. Call in 1 day(s).   Specialty:  Family Medicine Why:  Please call for an appointment. Contact information: [place] [address] [phone]            Contact information for after-discharge care    Destination    [facility] Preferred SNF .   Service:  Skilled Nursing Contact information: [place] [phone]                   The results of significant  diagnostics from this hospitalization (including imaging, microbiology, ancillary and laboratory) are listed below for reference.    Significant Diagnostic Studies: Pelvis Portable  Result Date: 11/01/2017 CLINICAL DATA:  Right hip fracture EXAM: PORTABLE PELVIS 1-2 VIEWS COMPARISON:  01/21/2014, 10/31/2017 FINDINGS: Intramedullary rod and distal screw fixation of the right femur across intertrochanteric fracture. Displaced lesser trochanteric fracture fragment. Right femoral head projects in joint. Gas within the soft tissues. IMPRESSION: Interval surgical fixation of comminuted right intertrochanteric fracture with expected surgical changes Electronically Signed   By: Donavan Foil M.D.   On: 11/01/2017 20:00   Dg Chest Port 1 View  Result Date: 11/05/2017 CLINICAL DATA:  Fever. EXAM: PORTABLE CHEST 1 VIEW COMPARISON:  Radiograph 10/31/2017 FINDINGS: Unchanged elevation of right hemidiaphragm with adjacent basilar atelectasis. Slight increase in linear atelectasis in the right midlung zone. No confluent airspace disease. Unchanged heart size and mediastinal contours with aortic atherosclerosis. No pneumothorax or evidence pulmonary edema. Bones are under mineralized. IMPRESSION: Slight increased atelectasis in the right lung over the past 4 days. Otherwise unchanged with elevated right hemidiaphragm. Electronically Signed   By: Keith Rake M.D.   On: 11/05/2017 00:09   Dg Chest Port 1 View  Result Date: 10/31/2017 CLINICAL DATA:  Right hip fracture. Preoperative evaluation. EXAM: PORTABLE CHEST 1 VIEW COMPARISON:  None. FINDINGS: Normal sized heart. Tortuous and calcified thoracic aorta. Elevated right hemidiaphragm with minimal linear atelectasis or scarring at the right lung base. Clear left lung. Diffuse osteopenia. IMPRESSION: Elevated right hemidiaphragm with minimal linear atelectasis or scarring at the right lung base. Otherwise, unremarkable examination. Electronically Signed   By: Claudie Revering M.D.   On: 10/31/2017 17:03   Dg C-arm 1-60 Min-no Report  Result Date: 11/01/2017 Fluoroscopy was utilized by the requesting physician.  No radiographic interpretation.   Dg Hip Operative Unilat W Or W/o Pelvis Right  Result Date: 11/01/2017 CLINICAL DATA:  Right hip IM nail EXAM: OPERATIVE RIGHT HIP (WITH PELVIS IF PERFORMED) 2 VIEWS TECHNIQUE: Fluoroscopic spot image(s) were submitted for interpretation post-operatively. COMPARISON:  10/31/2017 FINDINGS: Two intraoperative spot images demonstrate placement of IM nail and hip screw across the right femoral intertrochanteric fracture. Anatomic alignment. No hardware complicating feature. IMPRESSION: Internal fixation of the right intertrochanteric fracture with anatomic alignment. Electronically Signed   By: Rolm Baptise M.D.   On: 11/01/2017 19:22   Dg Hip Unilat  With Pelvis 2-3 Views Right  Result Date: 10/31/2017 CLINICAL DATA:  Fall at home.  Right hip pain.  Initial encounter. EXAM: DG HIP (WITH OR WITHOUT PELVIS) 2-3V RIGHT COMPARISON:  None. FINDINGS: Comminuted intertrochanteric fracture of the right hip is seen. No evidence of dislocation. IMPRESSION: Comminuted intertrochanteric fracture of right hip. Electronically Signed   By: Earle Gell M.D.   On: 10/31/2017 15:10  Microbiology: Recent Results (from the past 240 hour(s))  MRSA PCR Screening     Status: None   Collection Time: 11/01/17  2:00 AM  Result Value Ref Range Status   MRSA by PCR NEGATIVE NEGATIVE Final    Comment:        The GeneXpert MRSA Assay (FDA approved for NASAL specimens only), is one component of a comprehensive MRSA colonization surveillance program. It is not intended to diagnose MRSA infection nor to guide or monitor treatment for MRSA infections. Performed at [facility], [place] [address], [place], [place] [address]   Urine Culture     Status: Abnormal   Collection Time: 11/01/17  5:30 AM  Result Value Ref Range Status    Specimen Description   Final    URINE, CLEAN CATCH Performed at [facility], [place] [address], [place], [place] [address]    Special Requests   Final    NONE Performed at [facility], [place] [address], [place], [place] [address]    Culture MULTIPLE SPECIES PRESENT, SUGGEST RECOLLECTION (A)  Final   Report Status 11/02/2017 FINAL  Final     Labs: Basic Metabolic Panel: Recent Labs  Lab 10/31/17 1621 11/01/17 0400 11/02/17 0444 11/03/17 0800 11/04/17 0446 11/05/17 0436  NA 142 141 141 141  --  140  K 3.7 4.2 4.0 4.1  --  3.9  CL 105 107 107 107  --  104  CO2 26 25 25 25   --  27  GLUCOSE 156* 131* 158* 106*  --  108*  BUN 22 24* 21 22  --  13  CREATININE 1.30* 1.26* 1.16* 1.29* 1.01* 0.91  CALCIUM 8.8* 8.6* 8.7* 8.2*  --  8.2*   Liver Function Tests: No results for input(s): AST, ALT, ALKPHOS, BILITOT, PROT, ALBUMIN in the last 168 hours. No results for input(s): LIPASE, AMYLASE in the last 168 hours. No results for input(s): AMMONIA in the last 168 hours. CBC: Recent Labs  Lab 10/31/17 1621 11/01/17 0400 11/02/17 0444 11/03/17 0800 11/04/17 0446 11/05/17 0436  WBC 14.4* 12.3* 14.5* 12.4* 11.1* 9.7  NEUTROABS 12.7*  --   --   --   --   --   HGB 13.2 11.2* 10.5* 8.9* 8.9* 9.0*  HCT 39.1 33.6* 31.4* 26.6* 26.8* 26.9*  MCV 94.4 94.1 94.0 95.3 95.4 95.4  PLT 264 242 208 201 205 238   Cardiac Enzymes: No results for input(s): CKTOTAL, CKMB, CKMBINDEX, TROPONINI in the last 168 hours. BNP: BNP (last 3 results) No results for input(s): BNP in the last 8760 hours.  ProBNP (last 3 results) No results for input(s): PROBNP in the last 8760 hours.  CBG: No results for input(s): GLUCAP in the last 168 hours.     Signed:  Kayleen Memos, MD [facility] 11/05/2017, 1:05 PM

## 2017-11-06 MED ORDER — HYDRALAZINE HCL 20 MG/ML IJ SOLN: 10.0000 mg | Freq: Four times a day (QID) | INTRAMUSCULAR | Status: DC | PRN

## 2017-11-06 NOTE — Progress Notes (Signed)
PROGRESS NOTE  Sylvia Villarreal VFI:[id number] DOB: 1924-06-14 DOA: 10/31/2017 PCP: Dorothyann Peng, NP  Brief Narrative:  Sylvia S Jonesis a 84 y.o.femalewith medical history significant forHTN, dementia presented to the ER after a fall on her R side, after she lost her balance while trying to stand up from a seated position and trying to grab her walker.  She sustained a right hip comminuted intratrochanteric fracture underwent right hip repair on 9 /10/2017 she was discharged 11/05/2017 to be transferred to inpatient rehab but her insurance  authorization has not come through yet and it will not be done until Monday  Interval history/Subjective: She is seen at bedside denies any pain she is a little more talkative today than previously per the nurse.  Patient is very selective as to when she eats ,she eats when her family is around  Assessment/Plan     Closed right hip fracture secondary to mechanical fall patient is waiting for transfer to rehab/SNF once her insurance gives approval  She is status post day 5 right hip repair  AKI on chronic kidney disease stage III.  She is back to baseline with her creatinine 0.91 we will continue to avoid nephrotoxic drugs.  Continue to hold losartan  Dementia waxing and waning she has some good days and bad days continue current management will reorientation and stimulation for conversation  Anemia of chronic disease with her kidney disease stage III .  Current hemoglobin is 9.0 we will continue to monitor  Resolved accelerated hypertension continue her current antihypertensive from home  DVT prophylaxis: SCD Code Status: DNR Family Communication: None at bedside Disposition Plan: To SNF/short-term rehab  Dr. Kyung Bacca [facility] Pager [phone]  11/06/2017, 10:39 AM  LOS: 6 days   Consultants:  Orthopedic  Social worker  Physical therapy  Procedures:  Right hip fracture repair  Antimicrobials:  None   Objective: Vitals:    Vitals:   11/06/17 0526 11/06/17 0830  BP: (!) 148/76   Pulse: (!) 102   Resp:    Temp: 98 F (36.7 C)   SpO2: 99% 98%    Exam:  Constitutional:  . Appears calm and comfortable Eyes:  . pupils and irises appear normal . Normal lids and conjunctivae ENMT:  . grossly normal hearing  . Lips appear normal . external ears, nose appear normal . Oropharynx: mucosa, tongue,posterior pharynx appear normal Neck:  . neck appears normal, no masses, normal ROM, supple . no thyromegaly Respiratory:  . CTA bilaterally, no w/r/r.  . Respiratory effort normal. No retractions or accessory muscle use Cardiovascular:  . RRR, no m/r/g . No LE extremity edema   . Normal pedal pulses Abdomen:  . Abdomen appears normal; no tenderness or masses . No hernias . No HSM Musculoskeletal:  . Digits/nails BUE: no clubbing, cyanosis, petechiae, infection . exam of joints, bones, muscles of at least one of following: head/neck, RUE, LUE, RLE, LLE   o strength and tone normal, no atrophy, no abnormal movements o No tenderness, masses o Normal ROM, no contractures  . gait and station Skin:  . No rashes, lesions, ulcers . palpation of skin: no induration or nodules Neurologic:  . CN 2-12 intact . Sensation all 4 extremities intact Psychiatric:  . Mental status o Mood, affect appropriate o Orientation to person, place,  . Pleasantly demented answering questions however appropriately   I have personally reviewed the following:   Data: . CBC and Chem-7  Scheduled Meds: . amLODipine  5 mg Oral Daily  .  diclofenac sodium  2 g Topical TID  . docusate sodium  100 mg Oral BID  . enoxaparin (LOVENOX) injection  40 mg Subcutaneous Q24H  . ipratropium-albuterol  3 mL Nebulization TID  . mouth rinse  15 mL Mouth Rinse BID  . oxyCODONE  10 mg Oral Q12H  . senna-docusate  2 tablet Oral BID   Continuous Infusions: . lactated ringers 75 mL/hr at 11/06/17 1000    Principal Problem:   Closed  right hip fracture (HCC) Active Problems:   Senile dementia with delusional features [facility])   Essential hypertension   Closed comminuted intertrochanteric fracture of proximal end of right femur ([place])   LOS: 6 days

## 2017-11-06 NOTE — Progress Notes (Signed)
CSW checked with [facility] this am to see if patient received [employer] authorization over night. SNF has not received auth. [facility] will not authorize over the weekend.   Pricilla Holm, MSW, [place] [facility] [phone]

## 2017-11-06 NOTE — Plan of Care (Signed)
Pt stable with no needs. Pt remains confused and weak overall. Family at bedside. No changes needed to care plans.

## 2017-11-07 MED ORDER — ENSURE ENLIVE PO LIQD: 237.0000 mL | Freq: Two times a day (BID) | ORAL | Status: DC

## 2017-11-07 NOTE — Plan of Care (Signed)
Pt stable with no needs. Pt still remains, weak, tired, and confused. No needs at present. Pt overall mentation improved and pt is eating well overall.

## 2017-11-07 NOTE — Progress Notes (Signed)
PROGRESS NOTE  Sylvia Villarreal MLY:[id number] DOB: 11/10/1924 DOA: 10/31/2017 PCP: Dorothyann Peng, NP  Brief Narrative:  Sylvia S Jonesis a 84 y.o.femalewith medical history significant forHTN, dementia presented to the ER after a fall on her R side, after she lost her balance while trying to stand up from a seated position and trying to grab her walker.  She sustained a right hip comminuted intratrochanteric fracture underwent right hip repair on 9 /10/2017 she was discharged 11/05/2017 to be transferred to inpatient rehab but her insurance  authorization has not come through yet and it will not be done until Monday  Interval history/Subjective: She is seen at bedside denies any pain she is a little more talkative today than previously per the nurse.  Patient is very selective as to when she eats ,she eats when her family is around  11/07/17: seen at bedside denies complains , more sleepy this morning maybe due to time of day.  Assessment/Plan     Closed right hip fracture secondary to mechanical fall patient is waiting for transfer to rehab/SNF once her insurance gives approval  She is status post day 5 right hip repair  AKI on chronic kidney disease stage III.  She is back to baseline with her creatinine 0.91 we will continue to avoid nephrotoxic drugs.  Continue to hold losartan  Dementia waxing and waning she has some good days and bad days continue current management will reorientation and stimulation for conversation  Anemia of chronic disease with her kidney disease stage III .  Current hemoglobin is 9.0 we will continue to monitor  Resolved accelerated hypertension continue her current antihypertensive from home  DVT prophylaxis: SCD Code Status: DNR Family Communication: None at bedside Disposition Plan: To SNF/short-term rehab  Dr. Kyung Bacca [facility] Pager [phone]  11/07/2017, 10:44 AM  LOS: 7 days   Consultants:  Orthopedic  Social worker  Physical  therapy  Procedures:  Right hip fracture repair  Antimicrobials:  None   Objective: Vitals:  Vitals:   11/06/17 2114 11/07/17 0453  BP: (!) 143/70 (!) 147/64  Pulse: (!) 117 (!) 105  Resp: 16 15  Temp: 98.7 F (37.1 C)   SpO2: 97% 90%    Exam:  Constitutional:  . Appears calm and comfortable Eyes:  . pupils and irises appear normal . Normal lids and conjunctivae ENMT:  . grossly normal hearing  . Lips appear normal . external ears, nose appear normal . Oropharynx: mucosa, tongue,posterior pharynx appear normal Neck:  . neck appears normal, no masses, normal ROM, supple . no thyromegaly Respiratory:  . CTA bilaterally, no w/r/r.  . Respiratory effort normal. No retractions or accessory muscle use Cardiovascular:  . RRR, no m/r/g . No LE extremity edema   . Normal pedal pulses Abdomen:  . Abdomen appears normal; no tenderness or masses . No hernias . No HSM Musculoskeletal:  . Digits/nails BUE: no clubbing, cyanosis, petechiae, infection . exam of joints, bones, muscles of at least one of following: head/neck, RUE, LUE, RLE, LLE   o strength and tone normal, no atrophy, no abnormal movements o No tenderness, masses o Normal ROM, no contractures  . gait and station Skin:  . No rashes, lesions, ulcers . palpation of skin: no induration or nodules Neurologic:  . CN 2-12 intact . Sensation all 4 extremities intact Psychiatric:  . Mental status o Mood, affect appropriate o Orientation to person, place,  . Pleasantly demented answering questions however appropriately   I have personally reviewed the  following:   Data: . CBC and Chem-7  Scheduled Meds: . amLODipine  5 mg Oral Daily  . diclofenac sodium  2 g Topical TID  . docusate sodium  100 mg Oral BID  . enoxaparin (LOVENOX) injection  40 mg Subcutaneous Q24H  . mouth rinse  15 mL Mouth Rinse BID  . oxyCODONE  10 mg Oral Q12H  . senna-docusate  2 tablet Oral BID   Continuous Infusions: .  lactated ringers 75 mL/hr at 11/07/17 0941    Principal Problem:   Closed right hip fracture (HCC) Active Problems:   Senile dementia with delusional features Sylvia Villarreal)   Essential hypertension   Closed comminuted intertrochanteric fracture of proximal end of right femur ([place])   LOS: 7 days

## 2017-11-08 DIAGNOSIS — M6281 Muscle weakness (generalized): Secondary | ICD-10-CM | POA: Diagnosis not present

## 2017-11-08 DIAGNOSIS — N183 Chronic kidney disease, stage 3 (moderate): Secondary | ICD-10-CM | POA: Diagnosis not present

## 2017-11-08 DIAGNOSIS — D649 Anemia, unspecified: Secondary | ICD-10-CM | POA: Diagnosis not present

## 2017-11-08 DIAGNOSIS — S72141D Displaced intertrochanteric fracture of right femur, subsequent encounter for closed fracture with routine healing: Secondary | ICD-10-CM | POA: Diagnosis not present

## 2017-11-08 DIAGNOSIS — D72829 Elevated white blood cell count, unspecified: Secondary | ICD-10-CM | POA: Diagnosis not present

## 2017-11-08 DIAGNOSIS — I10 Essential (primary) hypertension: Secondary | ICD-10-CM | POA: Diagnosis not present

## 2017-11-08 DIAGNOSIS — R195 Other fecal abnormalities: Secondary | ICD-10-CM | POA: Diagnosis not present

## 2017-11-08 DIAGNOSIS — I6330 Cerebral infarction due to thrombosis of unspecified cerebral artery: Secondary | ICD-10-CM | POA: Diagnosis not present

## 2017-11-08 DIAGNOSIS — M25551 Pain in right hip: Secondary | ICD-10-CM | POA: Diagnosis not present

## 2017-11-08 DIAGNOSIS — M810 Age-related osteoporosis without current pathological fracture: Secondary | ICD-10-CM | POA: Diagnosis not present

## 2017-11-08 DIAGNOSIS — Z4789 Encounter for other orthopedic aftercare: Secondary | ICD-10-CM | POA: Diagnosis not present

## 2017-11-08 DIAGNOSIS — R531 Weakness: Secondary | ICD-10-CM | POA: Diagnosis not present

## 2017-11-08 DIAGNOSIS — R29898 Other symptoms and signs involving the musculoskeletal system: Secondary | ICD-10-CM | POA: Diagnosis not present

## 2017-11-08 DIAGNOSIS — R000 Tachycardia, unspecified: Secondary | ICD-10-CM | POA: Diagnosis not present

## 2017-11-08 DIAGNOSIS — Z09 Encounter for follow-up examination after completed treatment for conditions other than malignant neoplasm: Secondary | ICD-10-CM | POA: Diagnosis not present

## 2017-11-08 DIAGNOSIS — Z743 Need for continuous supervision: Secondary | ICD-10-CM | POA: Diagnosis not present

## 2017-11-08 DIAGNOSIS — R5082 Postprocedural fever: Secondary | ICD-10-CM | POA: Diagnosis not present

## 2017-11-08 DIAGNOSIS — E876 Hypokalemia: Secondary | ICD-10-CM | POA: Diagnosis not present

## 2017-11-08 DIAGNOSIS — W19XXXA Unspecified fall, initial encounter: Secondary | ICD-10-CM | POA: Diagnosis not present

## 2017-11-08 DIAGNOSIS — S72001D Fracture of unspecified part of neck of right femur, subsequent encounter for closed fracture with routine healing: Secondary | ICD-10-CM | POA: Diagnosis not present

## 2017-11-08 DIAGNOSIS — R2689 Other abnormalities of gait and mobility: Secondary | ICD-10-CM | POA: Diagnosis not present

## 2017-11-08 DIAGNOSIS — R69 Illness, unspecified: Secondary | ICD-10-CM | POA: Diagnosis not present

## 2017-11-08 DIAGNOSIS — R4182 Altered mental status, unspecified: Secondary | ICD-10-CM | POA: Diagnosis not present

## 2017-11-08 DIAGNOSIS — Z9181 History of falling: Secondary | ICD-10-CM | POA: Diagnosis not present

## 2017-11-08 DIAGNOSIS — R279 Unspecified lack of coordination: Secondary | ICD-10-CM | POA: Diagnosis not present

## 2017-11-08 DIAGNOSIS — S72001A Fracture of unspecified part of neck of right femur, initial encounter for closed fracture: Secondary | ICD-10-CM | POA: Diagnosis not present

## 2017-11-08 DIAGNOSIS — R1319 Other dysphagia: Secondary | ICD-10-CM | POA: Diagnosis not present

## 2017-11-08 DIAGNOSIS — R41841 Cognitive communication deficit: Secondary | ICD-10-CM | POA: Diagnosis not present

## 2017-11-08 LAB — CREATININE, SERUM
Creatinine, Ser: 0.93 mg/dL (ref 0.44–1.00)
GFR calc non Af Amer: 52 mL/min — ABNORMAL LOW (ref >60)

## 2017-11-08 MED ORDER — ENOXAPARIN SODIUM 30 MG/0.3ML ~~LOC~~ SOLN: 30.0000 mg | SUBCUTANEOUS | 0 refills | Status: AC

## 2017-11-08 MED ORDER — ENSURE ENLIVE PO LIQD: 237.0000 mL | Freq: Two times a day (BID) | ORAL | 0 refills | Status: AC

## 2017-11-08 NOTE — Clinical Social Work Placement (Addendum)
[facility] arranged for 12: 30 pick up.  Updated d/c summary sent.  Nurse given the number to call report.   CLINICAL SOCIAL WORK PLACEMENT  NOTE  Date:  11/08/2017  Patient Details  Name: Sylvia Villarreal MRN: [id number] Date of Birth: 1924/10/19  Clinical Social Work is seeking post-discharge placement for this patient at the [place] level of care (*CSW will initial, date and re-position this form in  chart as items are completed):  Yes   Patient/family provided with [place] Work Department's list of facilities offering this level of care within the geographic area requested by the patient (or if unable, by the patient's family).  Yes   Patient/family informed of their freedom to choose among providers that offer the needed level of care, that participate in Medicare, Medicaid or managed care program needed by the patient, have an available bed and are willing to accept the patient.  Yes   Patient/family informed of [place]'s ownership interest in [facility] and [facility], as well as of the fact that they are under no obligation to receive care at these facilities.  PASRR submitted to EDS on       PASRR number received on       Existing PASRR number confirmed on 11/03/17     FL2 transmitted to all facilities in geographic area requested by pt/family on       FL2 transmitted to all facilities within larger geographic area on 11/03/17     Patient informed that his/her managed care company has contracts with or will negotiate with certain facilities, including the following:  [employer]     Yes   Patient/family informed of bed offers received.  Patient chooses bed at [facility]     Physician recommends and patient chooses bed at      Patient to be transferred to [facility] on 11/08/17.  Patient to be transferred to facility by [facility]      Patient family notified on 11/08/17 of transfer.  Name of family member notified:   Daughter Hassan Rowan      PHYSICIAN Please sign DNR, Please prepare priority discharge summary, including medications     Additional Comment:    _______________________________________________ Lia Hopping, LCSW 11/08/2017, 10:58 AM

## 2017-11-08 NOTE — Discharge Summary (Addendum)
Discharge Summary  Sylvia Villarreal EYC:[id number] DOB: 04/11/1924  PCP: Dorothyann Peng, NP  Admit date: 10/31/2017 Discharge date: 11/08/2017  Time spent: 25 minutes  UPDATE: Discharge delayed due to awaiting insurance authorization.   11/08/2017: Patient seen and examined at her bedside.  No acute events overnight.  Patient does not appear to be in distress.  O2 saturation 98% on room air.  Recommendations for Outpatient Follow-up:  1. Follow-up with your PCP 2. Follow-up with orthopedic surgery, Dr. Lyla Glassing 3. Take your medications as prescribed 4. Continue physical therapy as recommended by orthopedic surgery 5. Fall precautions  Discharge Diagnoses:  Active Hospital Problems   Diagnosis Date Noted  . Closed right hip fracture ([place]) 10/31/2017  . Closed comminuted intertrochanteric fracture of proximal end of right femur ([place]) 11/01/2017  . Senile dementia with delusional features ([place]) 01/13/2010  . Essential hypertension 03/23/2007    Resolved Hospital Problems  No resolved problems to display.    Discharge Condition: Stable  Diet recommendation: Resume previous diet  Vitals:   11/08/17 0604 11/08/17 0915  BP: 133/74 (!) 154/74  Pulse: (!) 117 (!) 110  Resp: 17   Temp: 98.6 F (37 C)   SpO2: 98% 97%    History of present illness:  Sylvia S Jonesis a 84 y.o.femalewith medical history significant forHTN, dementia presented to the ER after a fall on her R side, after she lost her balance while trying to stand up from a seated position and trying to grab her walker. Pt denies any chest pain, SOB, abdominal pain, fever/chills, cough. Pt is demented, and doesn't talk much. Xray of R hip showed comminuted intertrochanteric fracture. Ortho consulted. Pt admitted for further management.  -11/01/2017: POD #0 post right hip fracture repair.   -11/03/2017: No acute distress.  Denies pain with pain control. She is alert and pleasantly demented.  -11/04/2017: Patient seen and  examined at her bedside.  No acute events overnight.  Patient is pleasantly demented, does not appear to be in distress.  Denies any pain.  On the day of discharge, the patient was hemodynamically stable.  She will need to follow-up with orthopedic surgery Dr. Lyla Glassing, her PCP posthospitalization.  She will also continue physical therapy as recommended by orthopedic surgery.  Hospital Course:  Principal Problem:   Closed right hip fracture (HCC) Active Problems:   Senile dementia with delusional features [facility])   Essential hypertension   Closed comminuted intertrochanteric fracture of proximal end of right femur (HCC)  Closed right hip fracture secondary to mechanical fall X-ray showed R hip comminuted intertrochanteric fracture POD #7 post right hip fracture repair Continue pain management Continue bowel regimen Continue physical therapy as recommended by orthopedic surgery Take subcu Lovenox daily as DVT prophylaxis as recommended by orthopedic surgery Dr. Lyla Glassing x3 weeks or until you follow-up in his office.  Post op fever Tmax 101.3 No sign of infective process Blood cx x 2, cxr urine cx workup cxr with sign of atelectasis Incentive spirometer as tolerated So far unremarkable. U cx pending  Acute blood loss anemia post surgery Hemoglobin is 10.5 from baseline 13.2 Hemoglobin dropped from 10.5 to 8.9 today Hemoglobin plateaued at 8.9 No sign of overt bleeding Follow-up with PCP outpatient  AKI on CKD 3 Baseline creatinine 1.0 with GFR of 55 Creatinine on presentation 1.30 AKI is improving and nearly resolving Creatinine 1.01 today 11/04/2017 Avoid nephrotoxic agents/dehydration/hypotension Follow-up with PCP posthospitalization  Resolved accelerated hypertension most likely related to her pain On antihypertensive medications at home  Holding off losartan due to AKI Continue amlodipine 5 mg daily (was on 2.5 mg daily at home)  Sinus tachycardia possibly related to  dehydration Avoid dehydration Encourage oral fluid intake  Resolved leukocytosis Likely reactive UA chest X chest x-ray unremarkable  Dementia Pleasantly demented At baseline usually quiet and doesn't say much Reorient as needed Fall precautions     Code Status: DNR  Family Communication: Daughter at bedside on 11/01/2017 and 11/03/2017 and 11/05/17  Disposition Plan: SNF/short-term rehab post surgery when orthopedic surgery signs off   Consultants:  Orthopedic surgery  CSW  Procedures:  Post right hip fracture repair  Antimicrobials:  None  DVT prophylaxis: SCDs  Subcu Lovenox daily    Discharge Exam: BP (!) 154/74   Pulse (!) 110   Temp 98.6 F (37 C) (Oral)   Resp 17   Ht 5\' 4"  (1.626 m)   Wt 60.2 kg   SpO2 97%   BMI 22.78 kg/m  . General: 84 y.o. year-old female pleasntly demented, WD WN NAD Alert in the setting of dementia. . Cardiovascular: RRR no rubs or gallops. No thyromegaly or JVD.  Marland Kitchen Respiratory: Clear to auscultation with no wheezes or rales. Good inspiratory effort. . Abdomen: Soft nontender nondistended with normal bowel sounds x4 quadrants. . Musculoskeletal: No lower extremity edema. 2/4 pulses in all 4 extremities.  Surgical dressing at right hip appears clean with no drainage. Marland Kitchen Psychiatry: Mood is appropriate for condition and setting  Discharge Instructions You were cared for by a hospitalist during your hospital stay. If you have any questions about your discharge medications or the care you received while you were in the hospital after you are discharged, you can call the unit and asked to speak with the hospitalist on call if the hospitalist that took care of you is not available. Once you are discharged, your primary care physician will handle any further medical issues. Please note that NO REFILLS for any discharge medications will be authorized once you are discharged, as it is imperative that you return to your  primary care physician (or establish a relationship with a primary care physician if you do not have one) for your aftercare needs so that they can reassess your need for medications and monitor your lab values.   Allergies as of 11/08/2017      Reactions   Sulfonamide Derivatives Other (See Comments)   Unknown      Medication List    STOP taking these medications   ADVIL PO   losartan 100 MG tablet Commonly known as:  COZAAR     TAKE these medications   amLODipine 5 MG tablet Commonly known as:  NORVASC Take 1 tablet (5 mg total) by mouth daily. What changed:    medication strength  how much to take   diclofenac sodium 1 % Gel Commonly known as:  VOLTAREN Apply 2 g topically 3 (three) times daily.   enoxaparin 30 MG/0.3ML injection Commonly known as:  LOVENOX Inject 0.3 mLs (30 mg total) into the skin daily.   feeding supplement (ENSURE ENLIVE) Liqd Take 237 mLs by mouth 2 (two) times daily between meals.   HYDROcodone-acetaminophen 5-325 MG tablet Commonly known as:  NORCO/VICODIN Take 1-2 tablets by mouth every 4 (four) hours as needed for moderate pain.   senna-docusate 8.6-50 MG tablet Commonly known as:  Senokot-S Take 2 tablets by mouth 2 (two) times daily.      Allergies  Allergen Reactions  . Sulfonamide Derivatives Other (See Comments)  Unknown    Contact information for follow-up providers    Swinteck, Aaron Edelman, MD. Schedule an appointment as soon as possible for a visit in 2 weeks.   Specialty:  Orthopedic Surgery Why:  For wound re-check Contact information: [address] [place] [address] [phone]        Dorothyann Peng, NP. Call in 1 day(s).   Specialty:  Family Medicine Why:  Please call for an appointment. Contact information: [place] [address] [phone]            Contact information for after-discharge care    Destination    [facility] Preferred SNF .   Service:   Skilled Nursing Contact information: [place] [phone]                   The results of significant diagnostics from this hospitalization (including imaging, microbiology, ancillary and laboratory) are listed below for reference.    Significant Diagnostic Studies: Pelvis Portable  Result Date: 11/01/2017 CLINICAL DATA:  Right hip fracture EXAM: PORTABLE PELVIS 1-2 VIEWS COMPARISON:  01/21/2014, 10/31/2017 FINDINGS: Intramedullary rod and distal screw fixation of the right femur across intertrochanteric fracture. Displaced lesser trochanteric fracture fragment. Right femoral head projects in joint. Gas within the soft tissues. IMPRESSION: Interval surgical fixation of comminuted right intertrochanteric fracture with expected surgical changes Electronically Signed   By: Donavan Foil M.D.   On: 11/01/2017 20:00   Dg Chest Port 1 View  Result Date: 11/05/2017 CLINICAL DATA:  Fever. EXAM: PORTABLE CHEST 1 VIEW COMPARISON:  Radiograph 10/31/2017 FINDINGS: Unchanged elevation of right hemidiaphragm with adjacent basilar atelectasis. Slight increase in linear atelectasis in the right midlung zone. No confluent airspace disease. Unchanged heart size and mediastinal contours with aortic atherosclerosis. No pneumothorax or evidence pulmonary edema. Bones are under mineralized. IMPRESSION: Slight increased atelectasis in the right lung over the past 4 days. Otherwise unchanged with elevated right hemidiaphragm. Electronically Signed   By: Keith Rake M.D.   On: 11/05/2017 00:09   Dg Chest Port 1 View  Result Date: 10/31/2017 CLINICAL DATA:  Right hip fracture. Preoperative evaluation. EXAM: PORTABLE CHEST 1 VIEW COMPARISON:  None. FINDINGS: Normal sized heart. Tortuous and calcified thoracic aorta. Elevated right hemidiaphragm with minimal linear atelectasis or scarring at the right lung base. Clear left lung. Diffuse osteopenia. IMPRESSION: Elevated  right hemidiaphragm with minimal linear atelectasis or scarring at the right lung base. Otherwise, unremarkable examination. Electronically Signed   By: Claudie Revering M.D.   On: 10/31/2017 17:03   Dg C-arm 1-60 Min-no Report  Result Date: 11/01/2017 Fluoroscopy was utilized by the requesting physician.  No radiographic interpretation.   Dg Hip Operative Unilat W Or W/o Pelvis Right  Result Date: 11/01/2017 CLINICAL DATA:  Right hip IM nail EXAM: OPERATIVE RIGHT HIP (WITH PELVIS IF PERFORMED) 2 VIEWS TECHNIQUE: Fluoroscopic spot image(s) were submitted for interpretation post-operatively. COMPARISON:  10/31/2017 FINDINGS: Two intraoperative spot images demonstrate placement of IM nail and hip screw across the right femoral intertrochanteric fracture. Anatomic alignment. No hardware complicating feature. IMPRESSION: Internal fixation of the right intertrochanteric fracture with anatomic alignment. Electronically Signed   By: Rolm Baptise M.D.   On: 11/01/2017 19:22   Dg Hip Unilat  With Pelvis 2-3 Views Right  Result Date: 10/31/2017 CLINICAL DATA:  Fall at home.  Right hip pain.  Initial encounter. EXAM: DG HIP (WITH OR WITHOUT PELVIS) 2-3V RIGHT COMPARISON:  None. FINDINGS: Comminuted intertrochanteric  fracture of the right hip is seen. No evidence of dislocation. IMPRESSION: Comminuted intertrochanteric fracture of right hip. Electronically Signed   By: Earle Gell M.D.   On: 10/31/2017 15:10    Microbiology: Recent Results (from the past 240 hour(s))  MRSA PCR Screening     Status: None   Collection Time: 11/01/17  2:00 AM  Result Value Ref Range Status   MRSA by PCR NEGATIVE NEGATIVE Final    Comment:        The GeneXpert MRSA Assay (FDA approved for NASAL specimens only), is one component of a comprehensive MRSA colonization surveillance program. It is not intended to diagnose MRSA infection nor to guide or monitor treatment for MRSA infections. Performed at [facility], [place] [address] [place], [place] [address]   Urine Culture     Status: Abnormal   Collection Time: 11/01/17  5:30 AM  Result Value Ref Range Status   Specimen Description   Final    URINE, CLEAN CATCH Performed at [facility], [place] [address], [place], [place] [address]    Special Requests   Final    NONE Performed at [facility], [place] [address], [place], [place] [address]    Culture MULTIPLE SPECIES PRESENT, SUGGEST RECOLLECTION (A)  Final   Report Status 11/02/2017 FINAL  Final  Culture, blood (routine x 2)     Status: None (Preliminary result)   Collection Time: 11/04/17 11:25 PM  Result Value Ref Range Status   Specimen Description   Final    BLOOD RIGHT HAND Performed at [facility] [address], [place], [place] [address]    Special Requests   Final    BOTTLES DRAWN AEROBIC ONLY Blood Culture adequate volume Performed at [facility] [address], [place], [place] [address]    Culture   Final    NO GROWTH 3 DAYS Performed at [facility], [place] [address], [place], [place] [address]    Report Status PENDING  Incomplete  Culture, blood (routine x 2)     Status: None (Preliminary result)   Collection Time: 11/04/17 11:25 PM  Result Value Ref Range Status   Specimen Description   Final    BLOOD RIGHT ARM Performed at [facility] [address], [place], [place] [address]    Special Requests   Final    BOTTLES DRAWN AEROBIC AND ANAEROBIC Blood Culture adequate volume Performed at [facility] [address] [place], [place] [address]    Culture   Final    NO GROWTH 3 DAYS Performed at [facility], [place] [address], [place], [place] [address]    Report Status PENDING  Incomplete     Labs: Basic Metabolic Panel: Recent Labs  Lab 11/02/17 0444 11/03/17 0800 11/04/17 0446 11/05/17 0436 11/08/17 0343  NA 141 141  --   140  --   K 4.0 4.1  --  3.9  --   CL 107 107  --  104  --   CO2 25 25  --  27  --   GLUCOSE 158* 106*  --  108*  --   BUN 21 22  --  13  --   CREATININE 1.16* 1.29* 1.01* 0.91 0.93  CALCIUM 8.7* 8.2*  --  8.2*  --    Liver Function Tests: No results for input(s): AST, ALT, ALKPHOS, BILITOT, PROT, ALBUMIN in the last 168 hours.  No results for input(s): LIPASE, AMYLASE in the last 168 hours. No results for input(s): AMMONIA in the last 168 hours. CBC: Recent Labs  Lab 11/02/17 0444 11/03/17 0800 11/04/17 0446 11/05/17 0436  WBC 14.5* 12.4* 11.1* 9.7  HGB 10.5* 8.9* 8.9* 9.0*  HCT 31.4* 26.6* 26.8* 26.9*  MCV 94.0 95.3 95.4 95.4  PLT 208 201 205 238   Cardiac Enzymes: No results for input(s): CKTOTAL, CKMB, CKMBINDEX, TROPONINI in the last 168 hours. BNP: BNP (last 3 results) No results for input(s): BNP in the last 8760 hours.  ProBNP (last 3 results) No results for input(s): PROBNP in the last 8760 hours.  CBG: No results for input(s): GLUCAP in the last 168 hours.     Signed:  Kayleen Memos, MD [facility] 11/08/2017, 12:06 PM

## 2017-11-08 NOTE — Care Management Important Message (Signed)
Important Message  Patient Details  Name: Sylvia Villarreal MRN: [id number] Date of Birth: 08/11/1924   Medicare Important Message Given:  Yes    Kerin Salen 11/08/2017, 12:26 [place] Message  Patient Details  Name: Sylvia Villarreal MRN: [id number] Date of Birth: 02-02-1925   Medicare Important Message Given:  Yes    Kerin Salen 11/08/2017, 12:26 PM

## 2017-11-09 DIAGNOSIS — M25551 Pain in right hip: Secondary | ICD-10-CM | POA: Diagnosis not present

## 2017-11-09 DIAGNOSIS — R69 Illness, unspecified: Secondary | ICD-10-CM | POA: Diagnosis not present

## 2017-11-09 DIAGNOSIS — I10 Essential (primary) hypertension: Secondary | ICD-10-CM | POA: Diagnosis not present

## 2017-11-09 DIAGNOSIS — R2689 Other abnormalities of gait and mobility: Secondary | ICD-10-CM | POA: Diagnosis not present

## 2017-11-09 DIAGNOSIS — R5082 Postprocedural fever: Secondary | ICD-10-CM | POA: Diagnosis not present

## 2017-11-09 DIAGNOSIS — R000 Tachycardia, unspecified: Secondary | ICD-10-CM | POA: Diagnosis not present

## 2017-11-09 DIAGNOSIS — S72001A Fracture of unspecified part of neck of right femur, initial encounter for closed fracture: Secondary | ICD-10-CM | POA: Diagnosis not present

## 2017-11-09 DIAGNOSIS — Z9181 History of falling: Secondary | ICD-10-CM | POA: Diagnosis not present

## 2017-11-10 DIAGNOSIS — D72829 Elevated white blood cell count, unspecified: Secondary | ICD-10-CM | POA: Diagnosis not present

## 2017-11-10 DIAGNOSIS — I10 Essential (primary) hypertension: Secondary | ICD-10-CM | POA: Diagnosis not present

## 2017-11-10 DIAGNOSIS — R69 Illness, unspecified: Secondary | ICD-10-CM | POA: Diagnosis not present

## 2017-11-10 DIAGNOSIS — S72001A Fracture of unspecified part of neck of right femur, initial encounter for closed fracture: Secondary | ICD-10-CM | POA: Diagnosis not present

## 2017-11-10 LAB — CULTURE, BLOOD (ROUTINE X 2)
Culture: NO GROWTH
Culture: NO GROWTH
Special Requests: ADEQUATE
Special Requests: ADEQUATE

## 2017-11-11 DIAGNOSIS — R2689 Other abnormalities of gait and mobility: Secondary | ICD-10-CM | POA: Diagnosis not present

## 2017-11-11 DIAGNOSIS — M25551 Pain in right hip: Secondary | ICD-10-CM | POA: Diagnosis not present

## 2017-11-11 DIAGNOSIS — R69 Illness, unspecified: Secondary | ICD-10-CM | POA: Diagnosis not present

## 2017-11-11 DIAGNOSIS — Z9181 History of falling: Secondary | ICD-10-CM | POA: Diagnosis not present

## 2017-11-12 DIAGNOSIS — S72001A Fracture of unspecified part of neck of right femur, initial encounter for closed fracture: Secondary | ICD-10-CM | POA: Diagnosis not present

## 2017-11-12 DIAGNOSIS — D649 Anemia, unspecified: Secondary | ICD-10-CM | POA: Diagnosis not present

## 2017-11-16 DIAGNOSIS — S72141D Displaced intertrochanteric fracture of right femur, subsequent encounter for closed fracture with routine healing: Secondary | ICD-10-CM | POA: Diagnosis not present

## 2017-11-16 DIAGNOSIS — R195 Other fecal abnormalities: Secondary | ICD-10-CM | POA: Diagnosis not present

## 2017-11-16 DIAGNOSIS — D649 Anemia, unspecified: Secondary | ICD-10-CM | POA: Diagnosis not present

## 2017-11-17 DIAGNOSIS — Z9181 History of falling: Secondary | ICD-10-CM | POA: Diagnosis not present

## 2017-11-17 DIAGNOSIS — R2689 Other abnormalities of gait and mobility: Secondary | ICD-10-CM | POA: Diagnosis not present

## 2017-11-17 DIAGNOSIS — R69 Illness, unspecified: Secondary | ICD-10-CM | POA: Diagnosis not present

## 2017-11-17 DIAGNOSIS — M25551 Pain in right hip: Secondary | ICD-10-CM | POA: Diagnosis not present

## 2017-11-18 DIAGNOSIS — R195 Other fecal abnormalities: Secondary | ICD-10-CM | POA: Diagnosis not present

## 2017-11-18 DIAGNOSIS — D649 Anemia, unspecified: Secondary | ICD-10-CM | POA: Diagnosis not present

## 2017-11-18 DIAGNOSIS — E876 Hypokalemia: Secondary | ICD-10-CM | POA: Diagnosis not present

## 2017-11-19 DIAGNOSIS — R69 Illness, unspecified: Secondary | ICD-10-CM | POA: Diagnosis not present

## 2017-11-19 DIAGNOSIS — R2689 Other abnormalities of gait and mobility: Secondary | ICD-10-CM | POA: Diagnosis not present

## 2017-11-19 DIAGNOSIS — M25551 Pain in right hip: Secondary | ICD-10-CM | POA: Diagnosis not present

## 2017-11-19 DIAGNOSIS — Z9181 History of falling: Secondary | ICD-10-CM | POA: Diagnosis not present

## 2017-11-22 DIAGNOSIS — D649 Anemia, unspecified: Secondary | ICD-10-CM | POA: Diagnosis not present

## 2017-11-29 DIAGNOSIS — Z9181 History of falling: Secondary | ICD-10-CM | POA: Diagnosis not present

## 2017-11-29 DIAGNOSIS — R69 Illness, unspecified: Secondary | ICD-10-CM | POA: Diagnosis not present

## 2017-11-29 DIAGNOSIS — R2689 Other abnormalities of gait and mobility: Secondary | ICD-10-CM | POA: Diagnosis not present

## 2017-11-29 DIAGNOSIS — M25551 Pain in right hip: Secondary | ICD-10-CM | POA: Diagnosis not present

## 2017-12-07 DIAGNOSIS — I10 Essential (primary) hypertension: Secondary | ICD-10-CM | POA: Diagnosis not present

## 2017-12-07 DIAGNOSIS — R69 Illness, unspecified: Secondary | ICD-10-CM | POA: Diagnosis not present

## 2017-12-07 DIAGNOSIS — N183 Chronic kidney disease, stage 3 (moderate): Secondary | ICD-10-CM | POA: Diagnosis not present

## 2017-12-07 DIAGNOSIS — D649 Anemia, unspecified: Secondary | ICD-10-CM | POA: Diagnosis not present

## 2017-12-13 DIAGNOSIS — R69 Illness, unspecified: Secondary | ICD-10-CM | POA: Diagnosis not present

## 2017-12-13 DIAGNOSIS — I10 Essential (primary) hypertension: Secondary | ICD-10-CM | POA: Diagnosis not present

## 2017-12-13 DIAGNOSIS — D649 Anemia, unspecified: Secondary | ICD-10-CM | POA: Diagnosis not present

## 2017-12-13 DIAGNOSIS — N183 Chronic kidney disease, stage 3 (moderate): Secondary | ICD-10-CM | POA: Diagnosis not present

## 2017-12-16 DIAGNOSIS — Z5320 Procedure and treatment not carried out because of patient's decision for unspecified reasons: Secondary | ICD-10-CM | POA: Diagnosis not present

## 2017-12-16 DIAGNOSIS — D508 Other iron deficiency anemias: Secondary | ICD-10-CM | POA: Diagnosis not present

## 2017-12-16 DIAGNOSIS — E876 Hypokalemia: Secondary | ICD-10-CM | POA: Diagnosis not present

## 2017-12-21 DIAGNOSIS — R69 Illness, unspecified: Secondary | ICD-10-CM | POA: Diagnosis not present

## 2017-12-21 DIAGNOSIS — M25551 Pain in right hip: Secondary | ICD-10-CM | POA: Diagnosis not present

## 2017-12-21 DIAGNOSIS — R2689 Other abnormalities of gait and mobility: Secondary | ICD-10-CM | POA: Diagnosis not present

## 2017-12-21 DIAGNOSIS — S72141D Displaced intertrochanteric fracture of right femur, subsequent encounter for closed fracture with routine healing: Secondary | ICD-10-CM | POA: Diagnosis not present

## 2017-12-27 DIAGNOSIS — S72001A Fracture of unspecified part of neck of right femur, initial encounter for closed fracture: Secondary | ICD-10-CM | POA: Diagnosis not present

## 2017-12-27 DIAGNOSIS — D508 Other iron deficiency anemias: Secondary | ICD-10-CM | POA: Diagnosis not present

## 2017-12-27 DIAGNOSIS — R5082 Postprocedural fever: Secondary | ICD-10-CM | POA: Diagnosis not present

## 2017-12-27 DIAGNOSIS — I10 Essential (primary) hypertension: Secondary | ICD-10-CM | POA: Diagnosis not present

## 2017-12-27 DIAGNOSIS — R300 Dysuria: Secondary | ICD-10-CM | POA: Diagnosis not present

## 2017-12-27 DIAGNOSIS — N390 Urinary tract infection, site not specified: Secondary | ICD-10-CM | POA: Diagnosis not present

## 2017-12-29 DIAGNOSIS — S72141S Displaced intertrochanteric fracture of right femur, sequela: Secondary | ICD-10-CM | POA: Diagnosis not present

## 2017-12-29 DIAGNOSIS — R29898 Other symptoms and signs involving the musculoskeletal system: Secondary | ICD-10-CM | POA: Diagnosis not present

## 2017-12-29 DIAGNOSIS — R2689 Other abnormalities of gait and mobility: Secondary | ICD-10-CM | POA: Diagnosis not present

## 2017-12-29 DIAGNOSIS — Z4789 Encounter for other orthopedic aftercare: Secondary | ICD-10-CM | POA: Diagnosis not present

## 2017-12-31 DIAGNOSIS — I10 Essential (primary) hypertension: Secondary | ICD-10-CM | POA: Diagnosis not present

## 2017-12-31 DIAGNOSIS — Z9181 History of falling: Secondary | ICD-10-CM | POA: Diagnosis not present

## 2017-12-31 DIAGNOSIS — M810 Age-related osteoporosis without current pathological fracture: Secondary | ICD-10-CM | POA: Diagnosis not present

## 2017-12-31 DIAGNOSIS — Z8673 Personal history of transient ischemic attack (TIA), and cerebral infarction without residual deficits: Secondary | ICD-10-CM | POA: Diagnosis not present

## 2017-12-31 DIAGNOSIS — S72141D Displaced intertrochanteric fracture of right femur, subsequent encounter for closed fracture with routine healing: Secondary | ICD-10-CM | POA: Diagnosis not present

## 2017-12-31 DIAGNOSIS — R69 Illness, unspecified: Secondary | ICD-10-CM | POA: Diagnosis not present

## 2017-12-31 DIAGNOSIS — R1312 Dysphagia, oropharyngeal phase: Secondary | ICD-10-CM | POA: Diagnosis not present

## 2018-01-03 DIAGNOSIS — Z4789 Encounter for other orthopedic aftercare: Secondary | ICD-10-CM | POA: Diagnosis not present

## 2018-01-03 DIAGNOSIS — I129 Hypertensive chronic kidney disease with stage 1 through stage 4 chronic kidney disease, or unspecified chronic kidney disease: Secondary | ICD-10-CM | POA: Diagnosis not present

## 2018-01-03 DIAGNOSIS — R630 Anorexia: Secondary | ICD-10-CM | POA: Diagnosis not present

## 2018-01-03 DIAGNOSIS — N183 Chronic kidney disease, stage 3 (moderate): Secondary | ICD-10-CM | POA: Diagnosis not present

## 2018-01-04 DIAGNOSIS — I10 Essential (primary) hypertension: Secondary | ICD-10-CM | POA: Diagnosis not present

## 2018-01-04 DIAGNOSIS — M810 Age-related osteoporosis without current pathological fracture: Secondary | ICD-10-CM | POA: Diagnosis not present

## 2018-01-04 DIAGNOSIS — R69 Illness, unspecified: Secondary | ICD-10-CM | POA: Diagnosis not present

## 2018-01-04 DIAGNOSIS — S72141D Displaced intertrochanteric fracture of right femur, subsequent encounter for closed fracture with routine healing: Secondary | ICD-10-CM | POA: Diagnosis not present

## 2018-01-04 DIAGNOSIS — Z8673 Personal history of transient ischemic attack (TIA), and cerebral infarction without residual deficits: Secondary | ICD-10-CM | POA: Diagnosis not present

## 2018-01-04 DIAGNOSIS — R1312 Dysphagia, oropharyngeal phase: Secondary | ICD-10-CM | POA: Diagnosis not present

## 2018-01-04 DIAGNOSIS — Z9181 History of falling: Secondary | ICD-10-CM | POA: Diagnosis not present

## 2018-01-23 DEATH — deceased
